# Patient Record
Sex: Female | Born: 1935
Health system: Southern US, Community
[De-identification: ages and names within clinical notes are randomized; demographics above are authoritative.]

## PROBLEM LIST (undated history)

## (undated) DIAGNOSIS — C189 Malignant neoplasm of colon, unspecified: Secondary | ICD-10-CM

## (undated) DIAGNOSIS — I499 Cardiac arrhythmia, unspecified: Secondary | ICD-10-CM

## (undated) DIAGNOSIS — I10 Essential (primary) hypertension: Secondary | ICD-10-CM

## (undated) DIAGNOSIS — R51 Headache: Secondary | ICD-10-CM

## (undated) DIAGNOSIS — E119 Type 2 diabetes mellitus without complications: Secondary | ICD-10-CM

## (undated) DIAGNOSIS — J309 Allergic rhinitis, unspecified: Secondary | ICD-10-CM

## (undated) DIAGNOSIS — G8929 Other chronic pain: Secondary | ICD-10-CM

## (undated) HISTORY — DX: Headache: R51

## (undated) HISTORY — DX: Malignant neoplasm of colon, unspecified: C18.9

## (undated) HISTORY — DX: Cardiac arrhythmia, unspecified: I49.9

## (undated) HISTORY — PX: VESICOVAGINAL FISTULA CLOSURE W/ TAH: SUR271

## (undated) HISTORY — PX: BACK SURGERY: SHX140

## (undated) HISTORY — PX: BREAST SURGERY: SHX581

## (undated) HISTORY — PX: CATARACT EXTRACTION: SUR2

## (undated) HISTORY — PX: CARDIAC VALVE SURGERY: SHX40

## (undated) HISTORY — DX: Allergic rhinitis, unspecified: J30.9

## (undated) HISTORY — DX: Essential (primary) hypertension: I10

## (undated) HISTORY — DX: Type 2 diabetes mellitus without complications: E11.9

## (undated) HISTORY — PX: APPENDECTOMY: SHX54

## (undated) HISTORY — PX: NECK SURGERY: SHX720

## (undated) HISTORY — PX: EYE SURGERY: SHX253

## (undated) HISTORY — DX: Other chronic pain: G89.29

---

## 2010-05-31 ENCOUNTER — Ambulatory Visit (INDEPENDENT_AMBULATORY_CARE_PROVIDER_SITE_OTHER): Payer: Medicare Other | Admitting: Internal Medicine

## 2010-05-31 ENCOUNTER — Encounter: Payer: Self-pay | Admitting: Internal Medicine

## 2010-05-31 ENCOUNTER — Ambulatory Visit (INDEPENDENT_AMBULATORY_CARE_PROVIDER_SITE_OTHER)
Admission: RE | Admit: 2010-05-31 | Discharge: 2010-05-31 | Disposition: A | Discharge status: Home / Self Care | Payer: Medicare Other | Source: Ambulatory Visit | Attending: Internal Medicine | Admitting: Internal Medicine

## 2010-05-31 VITALS — BP 118/76 | HR 54 | Ht 59.0 in | Wt 126.6 lb

## 2010-05-31 DIAGNOSIS — J449 Chronic obstructive pulmonary disease, unspecified: Secondary | ICD-10-CM

## 2010-05-31 DIAGNOSIS — J3089 Other allergic rhinitis: Secondary | ICD-10-CM

## 2010-05-31 DIAGNOSIS — J4489 Other specified chronic obstructive pulmonary disease: Secondary | ICD-10-CM

## 2010-05-31 DIAGNOSIS — J4540 Moderate persistent asthma, uncomplicated: Secondary | ICD-10-CM | POA: Insufficient documentation

## 2010-05-31 MED ORDER — TIOTROPIUM BROMIDE MONOHYDRATE 18 MCG IN CAPS: 18.0000 ug | ORAL_CAPSULE | Freq: Every day | RESPIRATORY_TRACT | Status: DC

## 2010-05-31 NOTE — Patient Instructions (Addendum)
Orders- CXR- dx COPD                Schedule PFT  Sample Spiriva  1 daily- watch to see if this helps shortness of breath.   Test for alpha 1 antitrypsin deficiency  Samples 1) Nasonex   1-2 puffs each nostril, once daily                 2) Pataday     1 drop in eye once daily as needed for allergy

## 2010-05-31 NOTE — Assessment & Plan Note (Signed)
We will get cxr and PFT  I will try her on Spiriva, which she doesn't remember using before.

## 2010-05-31 NOTE — Progress Notes (Signed)
  Subjective:    Patient ID: Donna Greene, female    DOB: 11-09-1925, 77 y.o.   MRN: [id number]  HPI 05/31/10- 43 yoF never smoker referred by Dr Milus Glazier for evaluation of COPD. Widowed- was married to a smoker. Long hx of adult asthma, worse living in this area. Several inhalers have had little effect or make her shaky.Morning cough- coffee helps clear her chest. Says allergies were a problem in [place]. Was on allergy vaccine there- helped nasal congestion and headache. Breathing is worse in cold air which increases nasal congestion and headache. Dyspnea with sustained walking Comorbidities significant for colon cancer/ hemicolectomy, DM, HBP, arrhythmia, C5-6 fusion after a fall, bilateral mastectomy.   Review of Systems Constitutional:   No weight loss, night sweats,  Fevers, chills, fatigue, lassitude. HEENT:   No headaches,  Difficulty swallowing,  Tooth/dental problems,  Sore throat,               Has sneezing, itching, ear ache, nasal congestion, post nasal drip,   CV:  No chest pain,  Orthopnea, PND, swelling in lower extremities, anasarca, dizziness, palpitations  GI  No heartburn, indigestion, abdominal pain, nausea, vomiting, diarrhea, change in bowel habits, loss of appetite  Resp: Shortness of breath with exertion and at rest.  No excess mucus, Has-productive cough,  No non-productive cough,  No coughing up of blood.  No change in color of mucus.  No wheezing.  Skin: no rash or lesions.  GU: no dysuria, change in color of urine, no urgency or frequency.  No flank pain.  MS:  No joint pain or swelling.  No decreased range of motion.  No back pain.  Psych:  No change in mood or affect. No depression or anxiety.  No memory loss.      Objective:   Physical Exam General- Alert, Oriented, Affect-appropriate, Distress- none acute   medium build  Skin- rash-none, lesions- none, excoriation- none  Lymphadenopathy- none  Head- atraumatic  Eyes- Gross vision intact, PERRLA,  conjunctivae clear secretions  Ears- Hearing, canals, Tm - normal for age,  Nose- Clear, Septal dev, mucus, polyps, erosion, perforation   Throat- Mallampati II , mucosa clear , drainage- none, tonsils- atrophic  Neck- flexible , trachea midline, no stridor , thyroid nl, carotid no bruit  Chest - symmetrical excursion , unlabored     Heart/CV- RRR , no murmur , no gallop  , no rub, nl s1 s2                     - JVD- none , edema- none, stasis changes- none, varices- none     Lung- clear to P&A, diminished      wheeze- none, cough- none , dullness-none, rub- none     Chest wall-   Abd- tender-no, distended-no, bowel sounds-present, HSM- no  Br/ Gen/ Rectal- Not done, not indicated  Extrem- cyanosis- none, clubbing, none, atrophy- none, strength- nl  Neuro- grossly intact to observation         Assessment & Plan:

## 2010-06-05 ENCOUNTER — Encounter: Payer: Self-pay | Admitting: Internal Medicine

## 2010-06-05 NOTE — Assessment & Plan Note (Signed)
Allergic rhinitis tends to fade with age. There may be a vasomotor rhinitis component.

## 2010-06-09 ENCOUNTER — Telehealth: Payer: Self-pay | Admitting: Internal Medicine

## 2010-06-09 NOTE — Telephone Encounter (Signed)
Spoke to pt and she states that the spiriva felt as though it made her symptoms worse she used for 10 days and could not tolerate any longer pt states she has pft appt at 11am on Monday and would like to see dr young at that time.scheduled pt to see dr young at 11:30 on monday

## 2010-06-09 NOTE — Telephone Encounter (Signed)
Called and spoke with pt and she stated that the spiriva causes her to cough very bad.  She tried this for 10 days and stopped.  Pt is willing to try another med if [facility] wants her to.  Please advise. thanks

## 2010-06-12 ENCOUNTER — Encounter: Payer: Self-pay | Admitting: Internal Medicine

## 2010-06-12 ENCOUNTER — Ambulatory Visit (INDEPENDENT_AMBULATORY_CARE_PROVIDER_SITE_OTHER): Payer: Medicare Other | Admitting: Internal Medicine

## 2010-06-12 VITALS — BP 112/70 | HR 60 | Ht 59.0 in | Wt 127.0 lb

## 2010-06-12 DIAGNOSIS — J449 Chronic obstructive pulmonary disease, unspecified: Secondary | ICD-10-CM

## 2010-06-12 DIAGNOSIS — J3089 Other allergic rhinitis: Secondary | ICD-10-CM

## 2010-06-12 LAB — PULMONARY FUNCTION TEST

## 2010-06-12 MED ORDER — TIOTROPIUM BROMIDE MONOHYDRATE 18 MCG IN CAPS: 18.0000 ug | ORAL_CAPSULE | Freq: Every day | RESPIRATORY_TRACT | Status: DC

## 2010-06-12 MED ORDER — ALBUTEROL SULFATE HFA 108 (90 BASE) MCG/ACT IN AERS: 2.0000 | INHALATION_SPRAY | Freq: Four times a day (QID) | RESPIRATORY_TRACT | Status: DC | PRN

## 2010-06-12 MED ORDER — ZAFIRLUKAST 10 MG PO TABS: 10.0000 mg | ORAL_TABLET | Freq: Two times a day (BID) | ORAL | Status: DC

## 2010-06-12 NOTE — Assessment & Plan Note (Signed)
Minor small airway response to dilator. Hard to say she has much reactive airways disease. She doesn't need meds daily and we discussed letting her use meds intermitently . That will avoid the overdrying she complains of.

## 2010-06-12 NOTE — Progress Notes (Signed)
Subjective:    Patient ID: Donna Greene, female    DOB: 11/26/1925, 77 y.o.   MRN: [id number]  HPI    Review of Systems     Objective:   Physical Exam        Assessment & Plan:   Subjective:    Patient ID: Donna Greene, female    DOB: September 27, 1925, 77 y.o.   MRN: [id number]  HPI 05/31/10- 36 yoF never smoker referred by Dr Milus Glazier for evaluation of COPD. Widowed- was married to a smoker. Long hx of adult asthma, worse living in this area. Several inhalers have had little effect or make her shaky.Morning cough- coffee helps clear her chest. Says allergies were a problem in [place]. Was on allergy vaccine there- helped nasal congestion and headache. Breathing is worse in cold air which increases nasal congestion and headache. Dyspnea with sustained walking Comorbidities significant for colon cancer/ hemicolectomy, DM, HBP, arrhythmia, C5-6 fusion after a fall, bilateral mastectomy.  06/12/10 COPD Says she is better today than last week. We had given sample of Spiriva which overdried making mucus clearance difficult. She stopped Spiriva and replaced it with albuterol and mucinex- doing better. She tends to feel similar with other inhalers.  PFT- Normal- high flow rates likely normal and reflecting lack of well-defined normals for this age range.  Spiriva did help for a few days. Accolate used to help, used once daily- discussed alternatives.  Review of Systems Constitutional:   No weight loss, night sweats,  Fevers, chills, fatigue, lassitude. HEENT:   No headaches,  Difficulty swallowing,  Tooth/dental problems,  Sore throat,               Has sneezing, itching, ear ache, nasal congestion, post nasal drip,   CV:  No chest pain,  Orthopnea, PND, swelling in lower extremities, anasarca, dizziness, palpitations  GI  No heartburn, indigestion, abdominal pain, nausea, vomiting, diarrhea, change in bowel habits, loss of appetite  Resp: Shortness of breath with exertion and at rest.   No excess mucus, Has-productive cough,  No non-productive cough,  No coughing up of blood.  No change in color of mucus.  No wheezing.  Skin: no rash or lesions.  GU: no dysuria, change in color of urine, no urgency or frequency.  No flank pain.  MS:  No joint pain or swelling.  No decreased range of motion.  No back pain.  Psych:  No change in mood or affect. No depression or anxiety.  No memory loss.      Objective:   Physical Exam General- Alert, Oriented, Affect-appropriate, Distress- none acute   medium build  Skin- rash-none, lesions- none, excoriation- none  Lymphadenopathy- none  Head- atraumatic  Eyes- Gross vision intact, PERRLA, conjunctivae clear secretions  Ears- Hearing, canals, Tm - normal for age,  Nose- Clear, No-Septal dev, mucus, polyps, erosion, perforation   Throat- Mallampati II , mucosa clear , drainage- none, tonsils- atrophic  Neck- flexible , trachea midline, no stridor , thyroid nl, carotid no bruit  Chest - symmetrical excursion , unlabored     Heart/CV- RRR , no murmur , no gallop  , no rub, nl s1 s2                     - JVD- none , edema- none, stasis changes- none, varices- none     Lung- very clear to P&A,      wheeze- none, cough- none , dullness-none, rub- none     Chest  wall-   Abd- tender-no, distended-no, bowel sounds-present, HSM- no  Br/ Gen/ Rectal- Not done, not indicated  Extrem- cyanosis- none, clubbing, none, atrophy- none, strength- nl  Neuro- grossly intact to observation         Assessment & Plan:

## 2010-06-12 NOTE — Patient Instructions (Addendum)
Accolate script- can use up to twice daily before meals if needed  Spiriva script- can use one daily as needed  Proair rescue inhaler script- can use 2 puffs, up to 4 times daily as needed.

## 2010-06-12 NOTE — Progress Notes (Signed)
PFT done today. 

## 2010-06-14 NOTE — Assessment & Plan Note (Signed)
Has had nasal congestion and drainage at times, but not an active issue at this time of year.

## 2010-06-26 ENCOUNTER — Encounter: Payer: Self-pay | Admitting: Internal Medicine

## 2010-07-13 ENCOUNTER — Ambulatory Visit: Payer: Medicare Other | Admitting: Internal Medicine

## 2010-10-09 ENCOUNTER — Ambulatory Visit (INDEPENDENT_AMBULATORY_CARE_PROVIDER_SITE_OTHER): Payer: Medicare Other | Admitting: Internal Medicine

## 2010-10-09 ENCOUNTER — Encounter: Payer: Self-pay | Admitting: Internal Medicine

## 2010-10-09 VITALS — BP 142/82 | HR 63 | Ht 59.0 in | Wt 130.2 lb

## 2010-10-09 DIAGNOSIS — J449 Chronic obstructive pulmonary disease, unspecified: Secondary | ICD-10-CM

## 2010-10-09 DIAGNOSIS — J3089 Other allergic rhinitis: Secondary | ICD-10-CM

## 2010-10-09 NOTE — Progress Notes (Signed)
Patient ID: Donna Greene, female    DOB: October 15, 1925, 77 y.o.   MRN: [id number]  HPI 05/31/10- 50 yoF never smoker referred by Dr Milus Glazier for evaluation of COPD. Widowed- was married to a smoker. Long hx of adult asthma, worse living in this area. Several inhalers have had little effect or make her shaky.Morning cough- coffee helps clear her chest. Says allergies were a problem in [place]. Was on allergy vaccine there- helped nasal congestion and headache. Breathing is worse in cold air which increases nasal congestion and headache. Dyspnea with sustained walking Comorbidities significant for colon cancer/ hemicolectomy, DM, HBP, arrhythmia, C5-6 fusion after a fall, bilateral mastectomy.  06/12/10 COPD, Comorbidities significant for colon cancer/ hemicolectomy, DM, HBP, arrhythmia, C5-6 fusion after a fall, bilateral mastectomy. Says she is better today than last week. We had given sample of Spiriva which overdried making mucus clearance difficult. She stopped Spiriva and replaced it with albuterol and mucinex- doing better. She tends to feel similar with other inhalers.  PFT- Normal- high flow rates likely normal and reflecting lack of well-defined normals for this age range.  Spiriva did help for a few days. Accolate used to help, used once daily- discussed alternatives.  10/09/10- COPD-from passive smoking, Comorbidities significant for colon cancer/ hemicolectomy, DM, HBP, arrhythmia, C5-6 fusion after a fall, bilateral mastectomy. Meds dry her chest too much- she says all MDIs and even her nebulizer meds do that. Then, while in [place], they tried allergy shots- that helped her nose but not her chest. Complains that nasal sprays burn nose. She tried spring Nasonex on a Q-tip and wiping her nose was packed, but it did not help. Uses rescue inhaler only very occasionally. Accolate used to help but no longer helps now for more than 2 or 3 days at a time as measured by chest tightness and trace wheeze.  She denies attacks of shortness of breath. She is aware that she lost height and she fell and had compression fractures of vertebrae. We discussed how thoracic height loss can aggravate a sense of dyspnea by causing restriction. Chest x-ray 05/31/2010 had shown COPD with no acute process. Will get her flu vax at Dr Lauentsein's/ PCP.  ROS- Constitutional:   No-   weight loss, night sweats, fevers, chills, fatigue, lassitude. HEENT:   No-  headaches, difficulty swallowing, tooth/dental problems, sore throat,       No-  sneezing, itching, ear ache,   +nasal congestion, post nasal drip,  CV:  No-   chest pain, orthopnea, PND, swelling in lower extremities, anasarca, dizziness, palpitations Resp: + shortness of breath with exertion or at rest.              No-   productive cough,  No non-productive cough,  No-  coughing up of blood.              No-   change in color of mucus.  Little wheezing.   Skin: No-   rash or lesions. GI:  No-   heartburn, indigestion, abdominal pain, nausea, vomiting, diarrhea,                 change in bowel habits, loss of appetite GU: No-   dysuria, change in color of urine, no urgency or frequency.  No- flank pain. MS:  No-   joint pain or swelling.  No- decreased range of motion.  No- back pain. Neuro- grossly normal to observation, Or:  Psych:  No- change in mood or affect. No  depression or anxiety.  No memory loss.      Objective:   Physical Exam General- Alert, Oriented, Affect-appropriate, Distress- none acute, small, elderly woman. Skin- rash-none, lesions- none, excoriation- none Lymphadenopathy- none Head- atraumatic            Eyes- Gross vision intact, PERRLA, conjunctivae clear secretions            Ears- Hearing, canals            Nose- Clear, Septal dev, mucus, polyps, erosion, perforation             Throat- Mallampati II , mucosa clear , drainage- none, tonsils- atrophic Neck- flexible , trachea midline, no stridor , thyroid nl, carotid no  bruit Chest - symmetrical excursion , unlabored           Heart/CV- RRR , no murmur , no gallop  , no rub, nl s1 s2                           - JVD- none , edema- none, stasis changes- none, varices- none           Lung- clear to P&A, diminished and quiet, wheeze- none, cough- none , dullness-none, rub- none           Chest wall-  Abd- tender-no, distended-no, bowel sounds-present, HSM- no Br/ Gen/ Rectal- Not done, not indicated Extrem- cyanosis- none, clubbing, none, atrophy- none, strength- nl Neuro- grossly intact to observation

## 2010-10-09 NOTE — Patient Instructions (Addendum)
Neb treatment here just with saline for trial if available>> do not have normal saline available for nebulization of the esophagus now.  Try nasal saline gel- otc- ask at drug store

## 2010-10-12 NOTE — Assessment & Plan Note (Signed)
Per nasal complaints are more characteristic of overly dry mucosa rather than allergy. There may be a vasomotor component.

## 2010-10-12 NOTE — Assessment & Plan Note (Signed)
For her age, exam is unremarkable and it is difficult to attach objective findings to her complaints.

## 2010-10-20 ENCOUNTER — Other Ambulatory Visit: Payer: Self-pay | Admitting: Family Medicine

## 2010-10-20 DIAGNOSIS — R0781 Pleurodynia: Secondary | ICD-10-CM

## 2010-10-24 ENCOUNTER — Ambulatory Visit
Admission: RE | Admit: 2010-10-24 | Discharge: 2010-10-24 | Disposition: A | Discharge status: Home / Self Care | Payer: Medicare Other | Source: Ambulatory Visit | Attending: Family Medicine | Admitting: Family Medicine

## 2010-10-24 DIAGNOSIS — R0781 Pleurodynia: Secondary | ICD-10-CM

## 2011-01-16 DIAGNOSIS — M5126 Other intervertebral disc displacement, lumbar region: Secondary | ICD-10-CM | POA: Diagnosis not present

## 2011-01-16 DIAGNOSIS — M545 Low back pain: Secondary | ICD-10-CM | POA: Diagnosis not present

## 2011-01-31 DIAGNOSIS — IMO0002 Reserved for concepts with insufficient information to code with codable children: Secondary | ICD-10-CM | POA: Diagnosis not present

## 2011-01-31 DIAGNOSIS — M5137 Other intervertebral disc degeneration, lumbosacral region: Secondary | ICD-10-CM | POA: Diagnosis not present

## 2011-01-31 DIAGNOSIS — M545 Low back pain: Secondary | ICD-10-CM | POA: Diagnosis not present

## 2011-02-13 DIAGNOSIS — M5137 Other intervertebral disc degeneration, lumbosacral region: Secondary | ICD-10-CM | POA: Diagnosis not present

## 2011-03-12 DIAGNOSIS — M545 Low back pain: Secondary | ICD-10-CM | POA: Diagnosis not present

## 2011-03-12 DIAGNOSIS — M47817 Spondylosis without myelopathy or radiculopathy, lumbosacral region: Secondary | ICD-10-CM | POA: Diagnosis not present

## 2011-03-12 DIAGNOSIS — M5137 Other intervertebral disc degeneration, lumbosacral region: Secondary | ICD-10-CM | POA: Diagnosis not present

## 2011-03-20 DIAGNOSIS — M545 Low back pain: Secondary | ICD-10-CM | POA: Diagnosis not present

## 2011-03-20 DIAGNOSIS — M47817 Spondylosis without myelopathy or radiculopathy, lumbosacral region: Secondary | ICD-10-CM | POA: Diagnosis not present

## 2011-04-18 DIAGNOSIS — IMO0002 Reserved for concepts with insufficient information to code with codable children: Secondary | ICD-10-CM | POA: Diagnosis not present

## 2011-04-18 DIAGNOSIS — M545 Low back pain: Secondary | ICD-10-CM | POA: Diagnosis not present

## 2011-04-18 DIAGNOSIS — M47817 Spondylosis without myelopathy or radiculopathy, lumbosacral region: Secondary | ICD-10-CM | POA: Diagnosis not present

## 2011-04-24 DIAGNOSIS — M545 Low back pain: Secondary | ICD-10-CM | POA: Diagnosis not present

## 2011-04-24 DIAGNOSIS — IMO0002 Reserved for concepts with insufficient information to code with codable children: Secondary | ICD-10-CM | POA: Diagnosis not present

## 2011-05-14 DIAGNOSIS — IMO0002 Reserved for concepts with insufficient information to code with codable children: Secondary | ICD-10-CM | POA: Diagnosis not present

## 2011-05-14 DIAGNOSIS — M546 Pain in thoracic spine: Secondary | ICD-10-CM | POA: Diagnosis not present

## 2011-05-15 ENCOUNTER — Ambulatory Visit (INDEPENDENT_AMBULATORY_CARE_PROVIDER_SITE_OTHER): Payer: Medicare Other | Admitting: Family Medicine

## 2011-05-15 VITALS — BP 196/65 | HR 66 | Temp 97.8°F | Resp 16 | Ht 60.0 in | Wt 132.0 lb

## 2011-05-15 DIAGNOSIS — R1110 Vomiting, unspecified: Secondary | ICD-10-CM

## 2011-05-15 DIAGNOSIS — M549 Dorsalgia, unspecified: Secondary | ICD-10-CM

## 2011-05-15 DIAGNOSIS — K591 Functional diarrhea: Secondary | ICD-10-CM | POA: Diagnosis not present

## 2011-05-15 DIAGNOSIS — R55 Syncope and collapse: Secondary | ICD-10-CM

## 2011-05-15 DIAGNOSIS — R197 Diarrhea, unspecified: Secondary | ICD-10-CM

## 2011-05-15 LAB — COMPREHENSIVE METABOLIC PANEL
ALT: 19 U/L (ref 0–35)
AST: 29 U/L (ref 0–37)
Albumin: 4.4 g/dL (ref 3.5–5.2)
Alkaline Phosphatase: 72 U/L (ref 39–117)
BUN: 17 mg/dL (ref 6–23)
CO2: 25 mEq/L (ref 19–32)
Calcium: 9.5 mg/dL (ref 8.4–10.5)
Chloride: 102 mEq/L (ref 96–112)
Creat: 0.69 mg/dL (ref 0.50–1.10)
Glucose, Bld: 114 mg/dL — ABNORMAL HIGH (ref 70–99)
Potassium: 4 mEq/L (ref 3.5–5.3)
Sodium: 136 mEq/L (ref 135–145)
Total Bilirubin: 0.5 mg/dL (ref 0.3–1.2)
Total Protein: 7.3 g/dL (ref 6.0–8.3)

## 2011-05-15 LAB — POCT CBC
Granulocyte percent: 81.8 %G — AB (ref 37–80)
HCT, POC: 41.6 % (ref 37.7–47.9)
Hemoglobin: 13.8 g/dL (ref 12.2–16.2)
Lymph, poc: 2.2 (ref 0.6–3.4)
MCH, POC: 30.1 pg (ref 27–31.2)
MCHC: 33.2 g/dL (ref 31.8–35.4)
MCV: 90.9 fL (ref 80–97)
MID (cbc): 0.9 (ref 0–0.9)
MPV: 10.1 fL (ref 0–99.8)
POC Granulocyte: 14.1 — AB (ref 2–6.9)
POC LYMPH PERCENT: 13 %L (ref 10–50)
POC MID %: 5.2 %M (ref 0–12)
Platelet Count, POC: 307 10*3/uL (ref 142–424)
RBC: 4.58 M/uL (ref 4.04–5.48)
RDW, POC: 13.9 %
WBC: 17.2 10*3/uL — AB (ref 4.6–10.2)

## 2011-05-15 LAB — POCT SEDIMENTATION RATE: POCT SED RATE: 24 mm/hr — AB (ref 0–22)

## 2011-05-15 LAB — TSH: TSH: 0.93 u[IU]/mL (ref 0.350–4.500)

## 2011-05-15 NOTE — Patient Instructions (Signed)
Syncope You have had a fainting (syncopal) spell. A fainting episode is a sudden, short-lived loss of consciousness. It results in complete recovery. It occurs because there has been a temporary shortage of oxygen and/or sugar (glucose) to the brain. CAUSES   Blood pressure pills and other medications that may lower blood pressure below normal. Sudden changes in posture (sudden standing).   Over-medication. Take your medications as directed.   Standing too long. This can cause blood to pool in the legs.   Seizure disorders.   Low blood sugar (hypoglycemia) of diabetes. This more commonly causes coma.   Bearing down to go to the bathroom. This can cause your blood pressure to rise suddenly. Your body compensates by making the blood pressure too low when you stop bearing down.   Hardening of the arteries where the brain temporarily does not receive enough blood.   Irregular heart beat and circulatory problems.   Fear, emotional distress, injury, sight of blood, or illness.  Your caregiver will send you home if the syncope was from non-worrisome causes (benign). Depending on your age and health, you may stay to be monitored and observed. If you return home, have someone stay with you if your caregiver feels that is desirable. It is very important to keep all follow-up referrals and appointments in order to properly manage this condition. This is a serious problem which can lead to serious illness and death if not carefully managed.  WARNING: Do not drive or operate machinery until your caregiver feels that it is safe for you to do so. SEEK IMMEDIATE MEDICAL CARE IF:   You have another fainting episode or faint while lying or sitting down. DO NOT DRIVE YOURSELF. Call 911 if no other help is available.   You have chest pain, are feeling sick to your stomach (nausea), vomiting or abdominal pain.   You have an irregular heartbeat or one that is very fast (pulse over 120 beats per minute).    You have a loss of feeling in some part of your body or lose movement in your arms or legs.   You have difficulty with speech, confusion, severe weakness, or visual problems.   You become sweaty and/or feel light headed.  Make sure you are rechecked as instructed. Document Released: 12/25/2004 Document Revised: 12/14/2010 Document Reviewed: 08/15/2006 [employer] Patient Information 2012 [employer]. 

## 2011-05-15 NOTE — Progress Notes (Signed)
This 78 yo woman comes in worried about cancer.  She says that she has had diarrhea and vomiting, but is not losing weight.  She wants some tests to find out what is the problem.  Last colonoscopy 11/11 in [place] with Reece Agar, MD  1. Patient continues to have presyncopal episodes.  Seeing Dr. Jacinto Halim. 2. Patient has allergies 3. She is being treated for chronic back pain with steroid shots.  (Dr. Andria Rhein)  Patient lives 5 houses down from daughter Dorna Mai.  O:  NAD, pleasant Patient tends to ramble Neck:  No bruit Chest:  Clear Heart: regular, no murmur  A:  Chronic anxiety, past serious bowel problem with resection.  P:  May benefit from probiotics.

## 2011-05-17 DIAGNOSIS — R002 Palpitations: Secondary | ICD-10-CM | POA: Diagnosis not present

## 2011-05-17 DIAGNOSIS — I10 Essential (primary) hypertension: Secondary | ICD-10-CM | POA: Diagnosis not present

## 2011-05-17 DIAGNOSIS — R0602 Shortness of breath: Secondary | ICD-10-CM | POA: Diagnosis not present

## 2011-05-17 DIAGNOSIS — I498 Other specified cardiac arrhythmias: Secondary | ICD-10-CM | POA: Diagnosis not present

## 2011-06-08 DIAGNOSIS — S32009A Unspecified fracture of unspecified lumbar vertebra, initial encounter for closed fracture: Secondary | ICD-10-CM | POA: Diagnosis not present

## 2011-06-08 DIAGNOSIS — IMO0002 Reserved for concepts with insufficient information to code with codable children: Secondary | ICD-10-CM | POA: Diagnosis not present

## 2011-06-15 ENCOUNTER — Telehealth: Payer: Self-pay

## 2011-06-15 NOTE — Telephone Encounter (Signed)
.  [facility] The patient requested return call from Dr. Milus Glazier.  Please call patient at [phone].

## 2011-06-15 NOTE — Telephone Encounter (Signed)
Spoke w/pt and she stated that her back doctors, Dr Noel Gerold and Dr Rubin Payor, want Dr Milus Glazier to put pt back on Forteo now in hopes of strengthening her bones enough to do the surgery that is needed for her back. Pt wondered if Dr L can [facility] [place] [place], or if she needs OV first?

## 2011-06-15 NOTE — Telephone Encounter (Signed)
Please call in Forteo prescription for patient or have her specialists order what they said she needs.

## 2011-06-16 ENCOUNTER — Other Ambulatory Visit: Payer: Self-pay | Admitting: Family Medicine

## 2011-06-16 DIAGNOSIS — M810 Age-related osteoporosis without current pathological fracture: Secondary | ICD-10-CM

## 2011-06-16 MED ORDER — TERIPARATIDE (RECOMBINANT) 600 MCG/2.4ML ~~LOC~~ SOLN: 20.0000 ug | Freq: Every day | SUBCUTANEOUS | Status: DC

## 2011-06-18 ENCOUNTER — Other Ambulatory Visit: Payer: Self-pay | Admitting: Family Medicine

## 2011-06-18 NOTE — Telephone Encounter (Signed)
Not for DM, for Forteo injections.  Ok per [place] to rx.

## 2011-06-18 NOTE — Telephone Encounter (Signed)
I do not see why this patient needs these?  Does she have DM?  Who manages it?

## 2011-07-11 DIAGNOSIS — M546 Pain in thoracic spine: Secondary | ICD-10-CM | POA: Diagnosis not present

## 2011-07-11 DIAGNOSIS — M545 Low back pain: Secondary | ICD-10-CM | POA: Diagnosis not present

## 2011-07-11 DIAGNOSIS — IMO0002 Reserved for concepts with insufficient information to code with codable children: Secondary | ICD-10-CM | POA: Diagnosis not present

## 2011-07-11 DIAGNOSIS — S32009A Unspecified fracture of unspecified lumbar vertebra, initial encounter for closed fracture: Secondary | ICD-10-CM | POA: Diagnosis not present

## 2011-07-30 ENCOUNTER — Telehealth: Payer: Self-pay

## 2011-07-30 NOTE — Telephone Encounter (Signed)
Pt brought in paperwork showing the medications in injections she has been getting at Dr Penny Pia office. I have made a copy and put in Dr Cain Saupe box. Pt states that the injections are helping the pain in her back, so she would like to continue them, but she has been taking Forteo in order to strengthen her bones and she is afraid that the medication in the injections might weaken her bones. She doesn't want to do anything to counter the benefits from the [place]. She trusts Dr L and would like his opinion as to if he thinks it is OK for her to continue the injections. Dr Noel Gerold has said that her bones are not strong enough at this point for surgery. Also pt wondered if Dr Retia Passe had contacted Dr Elbert Ewings - he had told her that he and Dr Noel Gerold and Dr L would consult to determine plan for pt.

## 2011-07-30 NOTE — Telephone Encounter (Signed)
Although the injections have a small effect on bone strength, the Forteo will protect her, and, in fact, lead to much stronger bones in spite of the slight negative effects of the injections

## 2011-07-30 NOTE — Telephone Encounter (Signed)
[phone] Patient would like to know if the medication that she is being injected with is going to make her bones weaker since her specialist told her that her bones are too fragile to undergo surgery.

## 2011-07-31 NOTE — Telephone Encounter (Signed)
Gave pt message from Dr L and also encouraged her to RTC to see him about her knee pain which she reported when I talked w/her yesterday. She stated that she has fallen a couple of times due to it and we discussed safety and that she should discuss w/him and possible use of walker. Pt agreed and gave her his sch this week

## 2011-08-03 ENCOUNTER — Ambulatory Visit (INDEPENDENT_AMBULATORY_CARE_PROVIDER_SITE_OTHER): Payer: Medicare Other | Admitting: Family Medicine

## 2011-08-03 ENCOUNTER — Ambulatory Visit: Payer: Medicare Other

## 2011-08-03 VITALS — BP 170/68 | HR 62 | Temp 98.3°F | Resp 16 | Ht 60.0 in | Wt 133.0 lb

## 2011-08-03 DIAGNOSIS — M4800 Spinal stenosis, site unspecified: Secondary | ICD-10-CM

## 2011-08-03 DIAGNOSIS — R350 Frequency of micturition: Secondary | ICD-10-CM | POA: Diagnosis not present

## 2011-08-03 DIAGNOSIS — M25569 Pain in unspecified knee: Secondary | ICD-10-CM | POA: Diagnosis not present

## 2011-08-03 DIAGNOSIS — N390 Urinary tract infection, site not specified: Secondary | ICD-10-CM

## 2011-08-03 DIAGNOSIS — M25561 Pain in right knee: Secondary | ICD-10-CM

## 2011-08-03 LAB — POCT URINALYSIS DIPSTICK
Bilirubin, UA: NEGATIVE
Glucose, UA: NEGATIVE
Ketones, UA: NEGATIVE
Leukocytes, UA: NEGATIVE
Nitrite, UA: NEGATIVE
Protein, UA: NEGATIVE
Spec Grav, UA: <=1.005
Urobilinogen, UA: 0.2
pH, UA: 5.5

## 2011-08-03 LAB — POCT UA - MICROSCOPIC ONLY
Casts, Ur, LPF, POC: NEGATIVE
Crystals, Ur, HPF, POC: NEGATIVE
Epithelial cells, urine per micros: NEGATIVE
Mucus, UA: NEGATIVE
WBC, Ur, HPF, POC: NEGATIVE
Yeast, UA: NEGATIVE

## 2011-08-03 MED ORDER — CIPROFLOXACIN HCL 250 MG PO TABS: 250.0000 mg | ORAL_TABLET | Freq: Two times a day (BID) | ORAL | Status: AC

## 2011-08-03 MED ORDER — METHYLPREDNISOLONE ACETATE 80 MG/ML IJ SUSP
80.0000 mg | Freq: Once | INTRAMUSCULAR | Status: AC
  Administered 2011-08-03: 80 mg via INTRA_ARTICULAR

## 2011-08-03 NOTE — Progress Notes (Signed)
78 yo with osteoporosis who has had two significant falls in last two months.  She has struck her right knee and now has persistent pain with weight bearing.  No LOC, tends to trip on things because she doesn't lift leg  She has chronic back pain and surgeons refuse to do surgery because of the osteoporosis.  Interested in 2nd opinion because back is getting so bad.  Tramadol affects the bladder, does relieve back pain. Right side no longer hurts.  Objective:  Seen with daughter Right knee:  Mild medial joint line soft tissue swelling with no fluctuance.  From, NONTENDER, no ecchymosis Patient alert and articulate. [facility] reading (PRIMARY) by  Dr. Milus Glazier:  Joint space narrowing. Results for orders placed in visit on 08/03/11  POCT URINALYSIS DIPSTICK      Component Value Range   Color, UA yellow     Clarity, UA clear     Glucose, UA neg     Bilirubin, UA neg     Ketones, UA neg     Spec Grav, UA <=1.005     Blood, UA mod     pH, UA 5.5     Protein, UA neg     Urobilinogen, UA 0.2     Nitrite, UA neg     Leukocytes, UA Negative    POCT UA - MICROSCOPIC ONLY      Component Value Range   WBC, Ur, HPF, POC neg     RBC, urine, microscopic 0-2     Bacteria, U Microscopic trace     Mucus, UA neg     Epithelial cells, urine per micros neg     Crystals, Ur, HPF, POC neg     Casts, Ur, LPF, POC neg     Yeast, UA neg     A:   1. Knee pain, right  DG Knee Complete 4 Views Right, methylPREDNISolone acetate (DEPO-MEDROL) injection 80 mg  2. Urinary frequency  Urine culture, POCT urinalysis dipstick, POCT UA - Microscopic Only, ciprofloxacin (CIPRO) 250 MG tablet  3. Spinal stenosis  Ambulatory referral to Neurosurgery

## 2011-08-05 LAB — URINE CULTURE: Colony Count: 45000

## 2011-08-28 ENCOUNTER — Telehealth: Payer: Self-pay

## 2011-08-28 NOTE — Telephone Encounter (Signed)
xcvsd

## 2011-09-25 ENCOUNTER — Ambulatory Visit (INDEPENDENT_AMBULATORY_CARE_PROVIDER_SITE_OTHER): Payer: Medicare Other | Admitting: Family Medicine

## 2011-09-25 VITALS — BP 140/68 | HR 75 | Temp 98.1°F | Resp 18 | Ht 59.5 in | Wt 134.8 lb

## 2011-09-25 DIAGNOSIS — L02239 Carbuncle of trunk, unspecified: Secondary | ICD-10-CM | POA: Diagnosis not present

## 2011-09-25 DIAGNOSIS — L02229 Furuncle of trunk, unspecified: Secondary | ICD-10-CM

## 2011-09-25 DIAGNOSIS — Z0000 Encounter for general adult medical examination without abnormal findings: Secondary | ICD-10-CM

## 2011-09-25 DIAGNOSIS — Z23 Encounter for immunization: Secondary | ICD-10-CM

## 2011-09-25 DIAGNOSIS — S32009A Unspecified fracture of unspecified lumbar vertebra, initial encounter for closed fracture: Secondary | ICD-10-CM

## 2011-09-25 DIAGNOSIS — S32040A Wedge compression fracture of fourth lumbar vertebra, initial encounter for closed fracture: Secondary | ICD-10-CM

## 2011-09-25 DIAGNOSIS — L02221 Furuncle of abdominal wall: Secondary | ICD-10-CM

## 2011-09-25 MED ORDER — HYDROCODONE-ACETAMINOPHEN 5-500 MG PO TABS: 1.0000 | ORAL_TABLET | Freq: Three times a day (TID) | ORAL | Status: DC | PRN

## 2011-09-25 MED ORDER — DOXYCYCLINE HYCLATE 100 MG PO TABS: 100.0000 mg | ORAL_TABLET | Freq: Two times a day (BID) | ORAL | Status: DC

## 2011-09-25 MED ORDER — INFLUENZA VIRUS VACC SPLIT PF IM SUSP: 0.5000 mL | INTRAMUSCULAR | Status: DC

## 2011-09-25 NOTE — Progress Notes (Signed)
78 yo with two months of skin lesion on abdomen which has pus and bleeds. Has been using cortaid and the lesion has spread and grown.  Exposed to MRSA through retarded granddaughter.  Also has violaceous lesion on right cheek for last week.  Nontender  No further syncopal spells.  Occasionally light headed.  The back pain issue continues.  Dr. Newell Coral has her records. (She wants a second opinion after Dr. Noel Gerold decided she was not a good candidate for surgery)  Patient has been taking Forteo.  Right knee is improving  Objective:  NAD Two 5 mm lesions on right epigastric area with pustular open centers  1 cm indurated violaceous area right cheek.  Right knee synovial thickening, full ROM, nontender, no crepitus  45 min face to face with patient 1. Furunculosis of abdominal wall  doxycycline (VIBRA-TABS) 100 MG tablet  2. Compression fracture of L4 lumbar vertebra  Ambulatory referral to Neurosurgery, HYDROcodone-acetaminophen (VICODIN) 5-500 MG per tablet  3. Healthcare maintenance  influenza  inactive virus vaccine (FLUZONE/FLUARIX) injection 0.5 mL   Zoster vaccine Rx given  follow up 1 week. Stop the cortaid

## 2011-10-02 ENCOUNTER — Ambulatory Visit (INDEPENDENT_AMBULATORY_CARE_PROVIDER_SITE_OTHER): Payer: Medicare Other | Admitting: Family Medicine

## 2011-10-02 VITALS — BP 132/78 | HR 66 | Temp 98.6°F | Resp 18 | Ht 59.5 in | Wt 134.0 lb

## 2011-10-02 DIAGNOSIS — L0202 Furuncle of face: Secondary | ICD-10-CM

## 2011-10-02 DIAGNOSIS — L02219 Cutaneous abscess of trunk, unspecified: Secondary | ICD-10-CM | POA: Diagnosis not present

## 2011-10-02 DIAGNOSIS — L02221 Furuncle of abdominal wall: Secondary | ICD-10-CM

## 2011-10-02 DIAGNOSIS — L0203 Carbuncle of face: Secondary | ICD-10-CM | POA: Diagnosis not present

## 2011-10-02 DIAGNOSIS — L03311 Cellulitis of abdominal wall: Secondary | ICD-10-CM

## 2011-10-02 DIAGNOSIS — L03319 Cellulitis of trunk, unspecified: Secondary | ICD-10-CM | POA: Diagnosis not present

## 2011-10-02 MED ORDER — DOXYCYCLINE HYCLATE 100 MG PO TABS: 100.0000 mg | ORAL_TABLET | Freq: Two times a day (BID) | ORAL | Status: DC

## 2011-10-02 MED ORDER — MUPIROCIN 2 % EX OINT: TOPICAL_OINTMENT | Freq: Three times a day (TID) | CUTANEOUS | Status: DC

## 2011-10-02 NOTE — Patient Instructions (Addendum)
Continue doxycycline for an additional week Use bactroban ointment on the sores.  See Dr. Milus Glazier in 5-7 days.

## 2011-10-02 NOTE — Progress Notes (Signed)
Subjective: Patient saw Dr. Elbert Ewings. last week and was treated with doxycycline for some sores on her abdominal wall. They've improved a little bit. She had a cluster of 3 places. The largest one continues to persist but is smaller than it was one just above it is smaller the one below is about gone. She thinks it may have been a spider bite. No culture was taken. She did not have an I&D done last week. She came back in here today having been told return in a week and get a penicillin shot.  Objective 1 cm nodule area on her mid abdominal wall with a tie placed just above it. Both are wrong the source of surfaces of them and keep on using a little bit of blood. Arch or one has central pallor to it it looked like it had pus in it although it felt fairly firm. Simple I&D was performed using ethyl chloride and dental #11 blade. A tiny incision was made right in the main area of the nodule, but no pus was obtained. The drainage was mostly bloody and was cultured.  Assessment: Residual abdominal wall abscesses, slowly improving  Plan: I think these are probably mostly sterile. We'll continue the doxycycline for a week and wait for the cultures to come back. I do not feel like a metabolic injection is indicated at this time today. Give Bactroban ointment to use on the place. Continue doxycycline for an additional week Use bactroban ointment on the sores.

## 2011-10-04 ENCOUNTER — Other Ambulatory Visit: Payer: Self-pay | Admitting: Family Medicine

## 2011-10-05 LAB — WOUND CULTURE: Gram Stain: NONE SEEN

## 2011-10-23 DIAGNOSIS — L821 Other seborrheic keratosis: Secondary | ICD-10-CM | POA: Diagnosis not present

## 2011-10-23 DIAGNOSIS — L578 Other skin changes due to chronic exposure to nonionizing radiation: Secondary | ICD-10-CM | POA: Diagnosis not present

## 2011-10-23 DIAGNOSIS — R233 Spontaneous ecchymoses: Secondary | ICD-10-CM | POA: Diagnosis not present

## 2011-10-23 DIAGNOSIS — L570 Actinic keratosis: Secondary | ICD-10-CM | POA: Diagnosis not present

## 2011-10-26 DIAGNOSIS — IMO0002 Reserved for concepts with insufficient information to code with codable children: Secondary | ICD-10-CM | POA: Diagnosis not present

## 2011-10-26 DIAGNOSIS — IMO0001 Reserved for inherently not codable concepts without codable children: Secondary | ICD-10-CM | POA: Diagnosis not present

## 2011-10-26 DIAGNOSIS — M545 Low back pain: Secondary | ICD-10-CM | POA: Diagnosis not present

## 2011-10-26 DIAGNOSIS — S32009A Unspecified fracture of unspecified lumbar vertebra, initial encounter for closed fracture: Secondary | ICD-10-CM | POA: Diagnosis not present

## 2011-10-30 DIAGNOSIS — H353190 Nonexudative age-related macular degeneration, unspecified eye, stage unspecified: Secondary | ICD-10-CM | POA: Diagnosis not present

## 2011-10-30 DIAGNOSIS — H04129 Dry eye syndrome of unspecified lacrimal gland: Secondary | ICD-10-CM | POA: Diagnosis not present

## 2011-12-24 ENCOUNTER — Other Ambulatory Visit: Payer: Self-pay | Admitting: Physician Assistant

## 2012-01-23 DIAGNOSIS — L570 Actinic keratosis: Secondary | ICD-10-CM | POA: Diagnosis not present

## 2012-01-23 DIAGNOSIS — L821 Other seborrheic keratosis: Secondary | ICD-10-CM | POA: Diagnosis not present

## 2012-01-23 DIAGNOSIS — R233 Spontaneous ecchymoses: Secondary | ICD-10-CM | POA: Diagnosis not present

## 2012-04-07 ENCOUNTER — Other Ambulatory Visit: Payer: Self-pay | Admitting: Physician Assistant

## 2012-04-07 NOTE — Telephone Encounter (Signed)
PT STATES SHE RECEIVED HER MEDS EXCEPT HER NEEDLES AND SHE REALLY NEED THEM PLEASE CALL [phone]   [facility]

## 2012-05-15 ENCOUNTER — Encounter: Payer: Self-pay | Admitting: Family Medicine

## 2012-05-15 ENCOUNTER — Ambulatory Visit (INDEPENDENT_AMBULATORY_CARE_PROVIDER_SITE_OTHER): Payer: Medicare Other | Admitting: Family Medicine

## 2012-05-15 VITALS — BP 154/74 | HR 77 | Temp 97.8°F | Resp 18 | Ht 60.0 in | Wt 134.6 lb

## 2012-05-15 DIAGNOSIS — J309 Allergic rhinitis, unspecified: Secondary | ICD-10-CM

## 2012-05-15 DIAGNOSIS — M5430 Sciatica, unspecified side: Secondary | ICD-10-CM

## 2012-05-15 DIAGNOSIS — M545 Low back pain, unspecified: Secondary | ICD-10-CM | POA: Diagnosis not present

## 2012-05-15 DIAGNOSIS — M5431 Sciatica, right side: Secondary | ICD-10-CM

## 2012-05-15 MED ORDER — PREDNISONE 20 MG PO TABS: ORAL_TABLET | ORAL | Status: DC

## 2012-05-15 NOTE — Progress Notes (Signed)
Son has moved in with patient.  She's had to adjust to his being in same house, leaving dishes.  Son is now disabled and not working.  Patient's husband died in Jun 04, 2005. Daughter brought in patient. Patient's son has sleep apnea and leg length discrepancy, but has been helping watch over patient since she had vertebral fractures 2 years ago  Patient is suffering from allergies, but is intolerant of antihistamines.  The back pain in right lumbar area is unremitting.  Dr. Randa Evens gave her some injections which did work for [employer].  Last injection was over 9 months ago.  Dr. Noel Gerold thought she had a pinched nerve, but couldn't do surgery because of her age and general condition.  She has finished her two years of [employer].   Objective:  Somewhat fragile appearing woman, NAD HEENT:  Injected conjunctivae, EOMI; oroph:  Clear;  left cerumen impaction cleared with lavage  Lumbar spine:  Very tender across entire lumbar region SLR:  Positive on the right at 80 degrees Reflexes:  Good KJ and AJ  Assessment:  Chronic low back pain with elements of sciatica but no significant nerve damage.  Allergic rhinitis  Plan:   Sciatica neuralgia, right - Plan: Ambulatory referral to Neurology, predniSONE (DELTASONE) 20 MG tablet  Allergic rhinitis - Plan: predniSONE (DELTASONE) 20 MG tablet  Signed,  Elvina Sidle, MD

## 2012-05-19 ENCOUNTER — Telehealth: Payer: Self-pay

## 2012-05-19 NOTE — Telephone Encounter (Signed)
Advised pt to get RF of prednisone to take per Dr L, and transferred her to Lupita Leash to give her details of her referral appt.

## 2012-05-19 NOTE — Telephone Encounter (Signed)
Pt states she sees dr Milus Glazier and he asked her to call in regards to her prednisone. States she was supposed to speak to him about how its working etc so he can decide to rx more or not. Pt is on her last day of the rx.   Best: [phone]  bf

## 2012-05-19 NOTE — Telephone Encounter (Signed)
Called her to see if this is helping. She states it has helped, wants to know if you want her to take any more, advised her we normally use short term, she states she has a refill, do you want her to refill this, please advise. I have asked donna about the referral she will check on this for her. The appt is May 22nd at 845. They are in [facility] they will mail her a new patient package.

## 2012-05-19 NOTE — Telephone Encounter (Signed)
Please refill and keep May 22 appt

## 2012-05-26 DIAGNOSIS — J309 Allergic rhinitis, unspecified: Secondary | ICD-10-CM | POA: Diagnosis not present

## 2012-05-26 DIAGNOSIS — R05 Cough: Secondary | ICD-10-CM | POA: Diagnosis not present

## 2012-07-03 DIAGNOSIS — M8440XA Pathological fracture, unspecified site, initial encounter for fracture: Secondary | ICD-10-CM | POA: Diagnosis not present

## 2012-07-03 DIAGNOSIS — M810 Age-related osteoporosis without current pathological fracture: Secondary | ICD-10-CM | POA: Diagnosis not present

## 2012-07-03 DIAGNOSIS — G8929 Other chronic pain: Secondary | ICD-10-CM | POA: Diagnosis not present

## 2012-07-03 DIAGNOSIS — R109 Unspecified abdominal pain: Secondary | ICD-10-CM | POA: Diagnosis not present

## 2012-07-07 DIAGNOSIS — R109 Unspecified abdominal pain: Secondary | ICD-10-CM | POA: Diagnosis not present

## 2012-07-15 DIAGNOSIS — R109 Unspecified abdominal pain: Secondary | ICD-10-CM | POA: Diagnosis not present

## 2012-07-15 DIAGNOSIS — M810 Age-related osteoporosis without current pathological fracture: Secondary | ICD-10-CM | POA: Diagnosis not present

## 2012-07-15 DIAGNOSIS — M8440XA Pathological fracture, unspecified site, initial encounter for fracture: Secondary | ICD-10-CM | POA: Diagnosis not present

## 2012-07-15 DIAGNOSIS — G8929 Other chronic pain: Secondary | ICD-10-CM | POA: Diagnosis not present

## 2012-07-17 DIAGNOSIS — Z8781 Personal history of (healed) traumatic fracture: Secondary | ICD-10-CM | POA: Diagnosis not present

## 2012-07-17 DIAGNOSIS — M810 Age-related osteoporosis without current pathological fracture: Secondary | ICD-10-CM | POA: Diagnosis not present

## 2012-07-17 DIAGNOSIS — Z79899 Other long term (current) drug therapy: Secondary | ICD-10-CM | POA: Diagnosis not present

## 2012-07-17 DIAGNOSIS — Z130 Encounter for screening for diseases of the blood and blood-forming organs and certain disorders involving the immune mechanism: Secondary | ICD-10-CM | POA: Diagnosis not present

## 2012-07-22 DIAGNOSIS — M810 Age-related osteoporosis without current pathological fracture: Secondary | ICD-10-CM | POA: Diagnosis not present

## 2012-08-01 DIAGNOSIS — Z79899 Other long term (current) drug therapy: Secondary | ICD-10-CM | POA: Diagnosis not present

## 2012-08-01 DIAGNOSIS — M810 Age-related osteoporosis without current pathological fracture: Secondary | ICD-10-CM | POA: Diagnosis not present

## 2012-08-01 DIAGNOSIS — Z8781 Personal history of (healed) traumatic fracture: Secondary | ICD-10-CM | POA: Diagnosis not present

## 2012-09-11 ENCOUNTER — Encounter: Payer: Self-pay | Admitting: Family Medicine

## 2012-09-11 ENCOUNTER — Ambulatory Visit (INDEPENDENT_AMBULATORY_CARE_PROVIDER_SITE_OTHER): Payer: Medicare Other | Admitting: Family Medicine

## 2012-09-11 VITALS — BP 142/76 | HR 70 | Temp 98.8°F | Resp 16 | Ht 60.0 in | Wt 136.4 lb

## 2012-09-11 DIAGNOSIS — M129 Arthropathy, unspecified: Secondary | ICD-10-CM

## 2012-09-11 DIAGNOSIS — J309 Allergic rhinitis, unspecified: Secondary | ICD-10-CM | POA: Diagnosis not present

## 2012-09-11 DIAGNOSIS — M542 Cervicalgia: Secondary | ICD-10-CM

## 2012-09-11 DIAGNOSIS — M1990 Unspecified osteoarthritis, unspecified site: Secondary | ICD-10-CM

## 2012-09-11 DIAGNOSIS — M549 Dorsalgia, unspecified: Secondary | ICD-10-CM | POA: Diagnosis not present

## 2012-09-11 MED ORDER — MOMETASONE FUROATE 50 MCG/ACT NA SUSP: 2.0000 | Freq: Every day | NASAL | Status: AC | PRN

## 2012-09-11 MED ORDER — LIDOCAINE 5 % EX PTCH: 1.0000 | MEDICATED_PATCH | CUTANEOUS | Status: DC
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
## 2012-10-28 DIAGNOSIS — E559 Vitamin D deficiency, unspecified: Secondary | ICD-10-CM | POA: Diagnosis not present

## 2012-10-28 DIAGNOSIS — Z85038 Personal history of other malignant neoplasm of large intestine: Secondary | ICD-10-CM | POA: Diagnosis not present

## 2012-10-28 DIAGNOSIS — Z79899 Other long term (current) drug therapy: Secondary | ICD-10-CM | POA: Diagnosis not present

## 2012-10-28 DIAGNOSIS — J309 Allergic rhinitis, unspecified: Secondary | ICD-10-CM | POA: Diagnosis not present

## 2012-10-28 DIAGNOSIS — I10 Essential (primary) hypertension: Secondary | ICD-10-CM | POA: Diagnosis not present

## 2012-10-28 DIAGNOSIS — M810 Age-related osteoporosis without current pathological fracture: Secondary | ICD-10-CM | POA: Diagnosis not present

## 2012-11-18 DIAGNOSIS — E875 Hyperkalemia: Secondary | ICD-10-CM | POA: Diagnosis not present

## 2012-11-18 DIAGNOSIS — M545 Low back pain: Secondary | ICD-10-CM | POA: Diagnosis not present

## 2012-11-18 DIAGNOSIS — IMO0002 Reserved for concepts with insufficient information to code with codable children: Secondary | ICD-10-CM | POA: Diagnosis not present

## 2012-11-18 DIAGNOSIS — J309 Allergic rhinitis, unspecified: Secondary | ICD-10-CM | POA: Diagnosis not present

## 2012-11-18 DIAGNOSIS — C189 Malignant neoplasm of colon, unspecified: Secondary | ICD-10-CM | POA: Diagnosis not present

## 2012-11-21 DIAGNOSIS — Q619 Cystic kidney disease, unspecified: Secondary | ICD-10-CM | POA: Diagnosis not present

## 2012-12-18 DIAGNOSIS — E875 Hyperkalemia: Secondary | ICD-10-CM | POA: Diagnosis not present

## 2012-12-18 DIAGNOSIS — I10 Essential (primary) hypertension: Secondary | ICD-10-CM | POA: Diagnosis not present

## 2012-12-18 DIAGNOSIS — M545 Low back pain: Secondary | ICD-10-CM | POA: Diagnosis not present

## 2012-12-18 DIAGNOSIS — J309 Allergic rhinitis, unspecified: Secondary | ICD-10-CM | POA: Diagnosis not present

## 2013-02-18 DIAGNOSIS — J449 Chronic obstructive pulmonary disease, unspecified: Secondary | ICD-10-CM | POA: Diagnosis not present

## 2013-02-18 DIAGNOSIS — Z79899 Other long term (current) drug therapy: Secondary | ICD-10-CM | POA: Diagnosis not present

## 2013-02-18 DIAGNOSIS — M810 Age-related osteoporosis without current pathological fracture: Secondary | ICD-10-CM | POA: Diagnosis not present

## 2013-02-18 DIAGNOSIS — R1900 Intra-abdominal and pelvic swelling, mass and lump, unspecified site: Secondary | ICD-10-CM | POA: Diagnosis not present

## 2013-02-18 DIAGNOSIS — I10 Essential (primary) hypertension: Secondary | ICD-10-CM | POA: Diagnosis not present

## 2013-02-20 DIAGNOSIS — R634 Abnormal weight loss: Secondary | ICD-10-CM | POA: Diagnosis not present

## 2013-02-20 DIAGNOSIS — C180 Malignant neoplasm of cecum: Secondary | ICD-10-CM | POA: Diagnosis not present

## 2013-02-20 DIAGNOSIS — R1900 Intra-abdominal and pelvic swelling, mass and lump, unspecified site: Secondary | ICD-10-CM | POA: Diagnosis not present

## 2013-02-20 DIAGNOSIS — N281 Cyst of kidney, acquired: Secondary | ICD-10-CM | POA: Diagnosis not present

## 2013-02-27 DIAGNOSIS — K869 Disease of pancreas, unspecified: Secondary | ICD-10-CM | POA: Diagnosis not present

## 2013-02-27 DIAGNOSIS — Q619 Cystic kidney disease, unspecified: Secondary | ICD-10-CM | POA: Diagnosis not present

## 2013-02-27 DIAGNOSIS — K7689 Other specified diseases of liver: Secondary | ICD-10-CM | POA: Diagnosis not present

## 2013-03-10 DIAGNOSIS — K869 Disease of pancreas, unspecified: Secondary | ICD-10-CM | POA: Diagnosis not present

## 2013-03-10 DIAGNOSIS — C189 Malignant neoplasm of colon, unspecified: Secondary | ICD-10-CM | POA: Diagnosis not present

## 2013-03-10 DIAGNOSIS — IMO0002 Reserved for concepts with insufficient information to code with codable children: Secondary | ICD-10-CM | POA: Diagnosis not present

## 2013-03-10 DIAGNOSIS — Q619 Cystic kidney disease, unspecified: Secondary | ICD-10-CM | POA: Diagnosis not present

## 2013-03-24 DIAGNOSIS — Z85038 Personal history of other malignant neoplasm of large intestine: Secondary | ICD-10-CM | POA: Diagnosis not present

## 2013-03-24 DIAGNOSIS — R1011 Right upper quadrant pain: Secondary | ICD-10-CM | POA: Diagnosis not present

## 2013-04-06 DIAGNOSIS — Z85038 Personal history of other malignant neoplasm of large intestine: Secondary | ICD-10-CM | POA: Diagnosis not present

## 2013-04-06 DIAGNOSIS — K5730 Diverticulosis of large intestine without perforation or abscess without bleeding: Secondary | ICD-10-CM | POA: Diagnosis not present

## 2013-04-20 DIAGNOSIS — M949 Disorder of cartilage, unspecified: Secondary | ICD-10-CM | POA: Diagnosis not present

## 2013-04-20 DIAGNOSIS — M899 Disorder of bone, unspecified: Secondary | ICD-10-CM | POA: Diagnosis not present

## 2013-04-20 DIAGNOSIS — J309 Allergic rhinitis, unspecified: Secondary | ICD-10-CM | POA: Diagnosis not present

## 2013-04-20 DIAGNOSIS — I10 Essential (primary) hypertension: Secondary | ICD-10-CM | POA: Diagnosis not present

## 2013-04-20 DIAGNOSIS — M810 Age-related osteoporosis without current pathological fracture: Secondary | ICD-10-CM | POA: Diagnosis not present

## 2013-04-20 DIAGNOSIS — J45909 Unspecified asthma, uncomplicated: Secondary | ICD-10-CM | POA: Diagnosis not present

## 2013-05-08 DIAGNOSIS — R059 Cough, unspecified: Secondary | ICD-10-CM | POA: Diagnosis not present

## 2013-05-08 DIAGNOSIS — J309 Allergic rhinitis, unspecified: Secondary | ICD-10-CM | POA: Diagnosis not present

## 2013-05-08 DIAGNOSIS — R05 Cough: Secondary | ICD-10-CM | POA: Diagnosis not present

## 2013-05-08 DIAGNOSIS — J441 Chronic obstructive pulmonary disease with (acute) exacerbation: Secondary | ICD-10-CM | POA: Diagnosis not present

## 2013-05-14 DIAGNOSIS — J309 Allergic rhinitis, unspecified: Secondary | ICD-10-CM | POA: Diagnosis not present

## 2013-05-14 DIAGNOSIS — IMO0002 Reserved for concepts with insufficient information to code with codable children: Secondary | ICD-10-CM | POA: Diagnosis not present

## 2013-05-14 DIAGNOSIS — H1045 Other chronic allergic conjunctivitis: Secondary | ICD-10-CM | POA: Diagnosis not present

## 2013-07-09 DIAGNOSIS — H26499 Other secondary cataract, unspecified eye: Secondary | ICD-10-CM | POA: Diagnosis not present

## 2013-07-20 DIAGNOSIS — H1045 Other chronic allergic conjunctivitis: Secondary | ICD-10-CM | POA: Diagnosis not present

## 2013-07-20 DIAGNOSIS — K130 Diseases of lips: Secondary | ICD-10-CM | POA: Diagnosis not present

## 2013-07-20 DIAGNOSIS — J309 Allergic rhinitis, unspecified: Secondary | ICD-10-CM | POA: Diagnosis not present

## 2013-07-20 DIAGNOSIS — J449 Chronic obstructive pulmonary disease, unspecified: Secondary | ICD-10-CM | POA: Diagnosis not present

## 2013-10-20 DIAGNOSIS — Z23 Encounter for immunization: Secondary | ICD-10-CM | POA: Diagnosis not present

## 2013-10-20 DIAGNOSIS — E559 Vitamin D deficiency, unspecified: Secondary | ICD-10-CM | POA: Diagnosis not present

## 2013-10-20 DIAGNOSIS — J309 Allergic rhinitis, unspecified: Secondary | ICD-10-CM | POA: Diagnosis not present

## 2013-10-20 DIAGNOSIS — I10 Essential (primary) hypertension: Secondary | ICD-10-CM | POA: Diagnosis not present

## 2013-10-20 DIAGNOSIS — Z79899 Other long term (current) drug therapy: Secondary | ICD-10-CM | POA: Diagnosis not present

## 2013-10-20 DIAGNOSIS — E785 Hyperlipidemia, unspecified: Secondary | ICD-10-CM | POA: Diagnosis not present

## 2013-10-20 DIAGNOSIS — Z0000 Encounter for general adult medical examination without abnormal findings: Secondary | ICD-10-CM | POA: Diagnosis not present

## 2013-10-20 DIAGNOSIS — Z780 Asymptomatic menopausal state: Secondary | ICD-10-CM | POA: Diagnosis not present

## 2013-10-22 DIAGNOSIS — M810 Age-related osteoporosis without current pathological fracture: Secondary | ICD-10-CM | POA: Diagnosis not present

## 2013-10-22 DIAGNOSIS — Z1211 Encounter for screening for malignant neoplasm of colon: Secondary | ICD-10-CM | POA: Diagnosis not present

## 2013-10-27 DIAGNOSIS — Z1231 Encounter for screening mammogram for malignant neoplasm of breast: Secondary | ICD-10-CM | POA: Diagnosis not present

## 2013-11-03 DIAGNOSIS — L821 Other seborrheic keratosis: Secondary | ICD-10-CM | POA: Diagnosis not present

## 2013-11-03 DIAGNOSIS — L570 Actinic keratosis: Secondary | ICD-10-CM | POA: Diagnosis not present

## 2013-11-19 DIAGNOSIS — J309 Allergic rhinitis, unspecified: Secondary | ICD-10-CM | POA: Diagnosis not present

## 2013-11-19 DIAGNOSIS — E785 Hyperlipidemia, unspecified: Secondary | ICD-10-CM | POA: Diagnosis not present

## 2013-11-19 DIAGNOSIS — Z1389 Encounter for screening for other disorder: Secondary | ICD-10-CM | POA: Diagnosis not present

## 2013-11-19 DIAGNOSIS — M810 Age-related osteoporosis without current pathological fracture: Secondary | ICD-10-CM | POA: Diagnosis not present

## 2013-11-19 DIAGNOSIS — Z23 Encounter for immunization: Secondary | ICD-10-CM | POA: Diagnosis not present

## 2013-11-19 DIAGNOSIS — Q6100 Congenital renal cyst, unspecified: Secondary | ICD-10-CM | POA: Diagnosis not present

## 2013-11-19 DIAGNOSIS — Z9181 History of falling: Secondary | ICD-10-CM | POA: Diagnosis not present

## 2013-11-19 DIAGNOSIS — E559 Vitamin D deficiency, unspecified: Secondary | ICD-10-CM | POA: Diagnosis not present

## 2013-12-07 DIAGNOSIS — K869 Disease of pancreas, unspecified: Secondary | ICD-10-CM | POA: Diagnosis not present

## 2013-12-11 DIAGNOSIS — W230XXA Caught, crushed, jammed, or pinched between moving objects, initial encounter: Secondary | ICD-10-CM | POA: Diagnosis not present

## 2013-12-11 DIAGNOSIS — E780 Pure hypercholesterolemia: Secondary | ICD-10-CM | POA: Diagnosis not present

## 2013-12-11 DIAGNOSIS — S6992XA Unspecified injury of left wrist, hand and finger(s), initial encounter: Secondary | ICD-10-CM | POA: Diagnosis not present

## 2014-02-18 DIAGNOSIS — E785 Hyperlipidemia, unspecified: Secondary | ICD-10-CM | POA: Diagnosis not present

## 2014-02-18 DIAGNOSIS — J449 Chronic obstructive pulmonary disease, unspecified: Secondary | ICD-10-CM | POA: Diagnosis not present

## 2014-02-18 DIAGNOSIS — M792 Neuralgia and neuritis, unspecified: Secondary | ICD-10-CM | POA: Diagnosis not present

## 2014-02-18 DIAGNOSIS — E559 Vitamin D deficiency, unspecified: Secondary | ICD-10-CM | POA: Diagnosis not present

## 2014-02-18 DIAGNOSIS — Z79899 Other long term (current) drug therapy: Secondary | ICD-10-CM | POA: Diagnosis not present

## 2014-02-18 DIAGNOSIS — J309 Allergic rhinitis, unspecified: Secondary | ICD-10-CM | POA: Diagnosis not present

## 2014-02-18 DIAGNOSIS — Z681 Body mass index (BMI) 19 or less, adult: Secondary | ICD-10-CM | POA: Diagnosis not present

## 2014-02-18 DIAGNOSIS — I10 Essential (primary) hypertension: Secondary | ICD-10-CM | POA: Diagnosis not present

## 2014-03-09 IMAGING — CR DG KNEE COMPLETE 4+V*R*
4 series · 4 of 4 positions shown · non-contrast
Comparison: None

CLINICAL DATA: Knee pain and and swelling, injury/fall 1 month ago

RIGHT KNEE - COMPLETE 4+ VIEW

[AP]
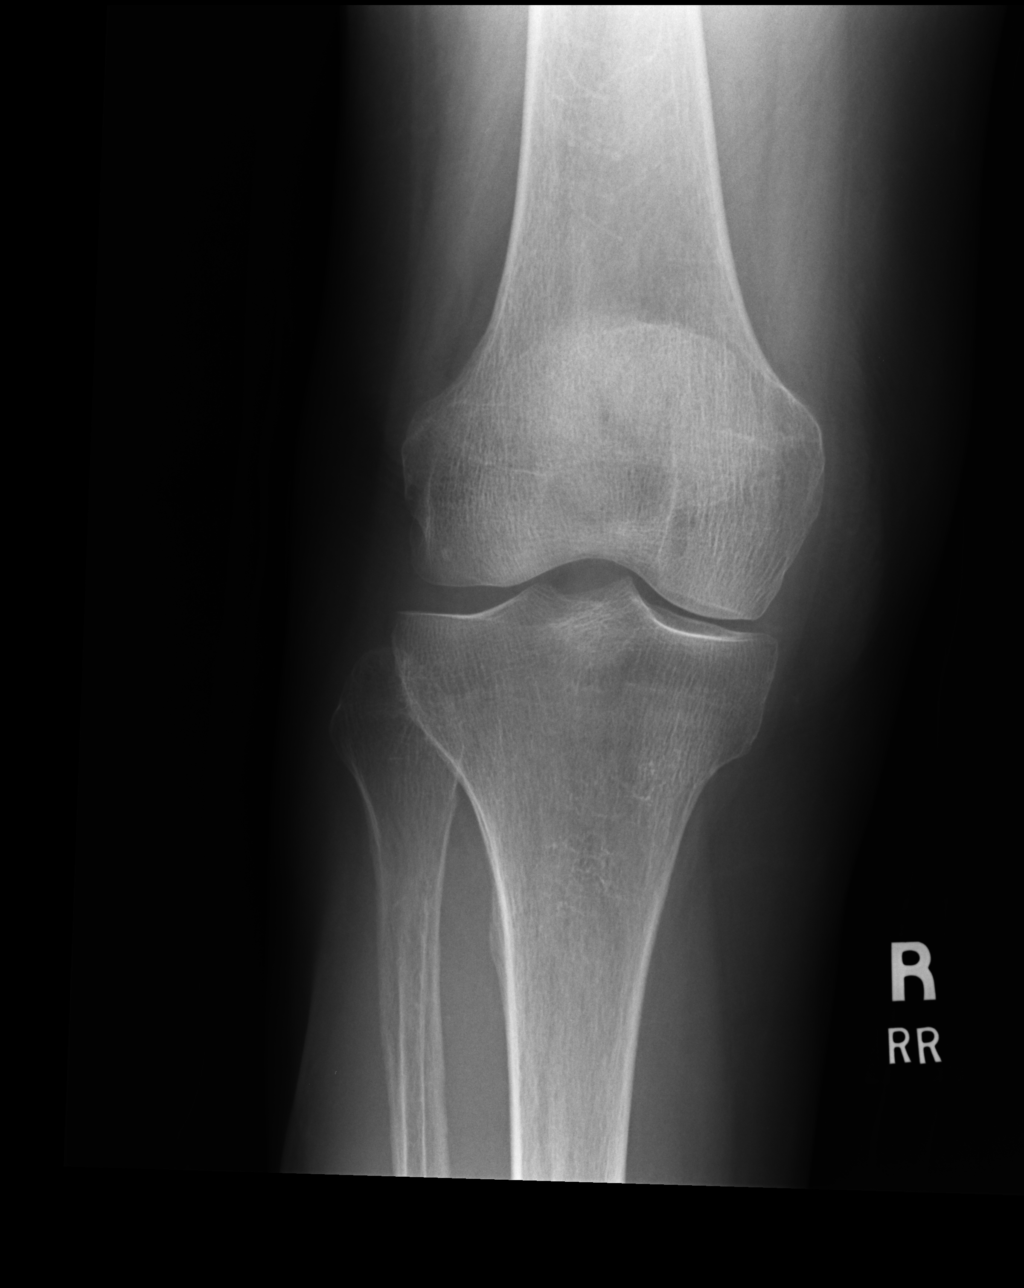

[lateral]
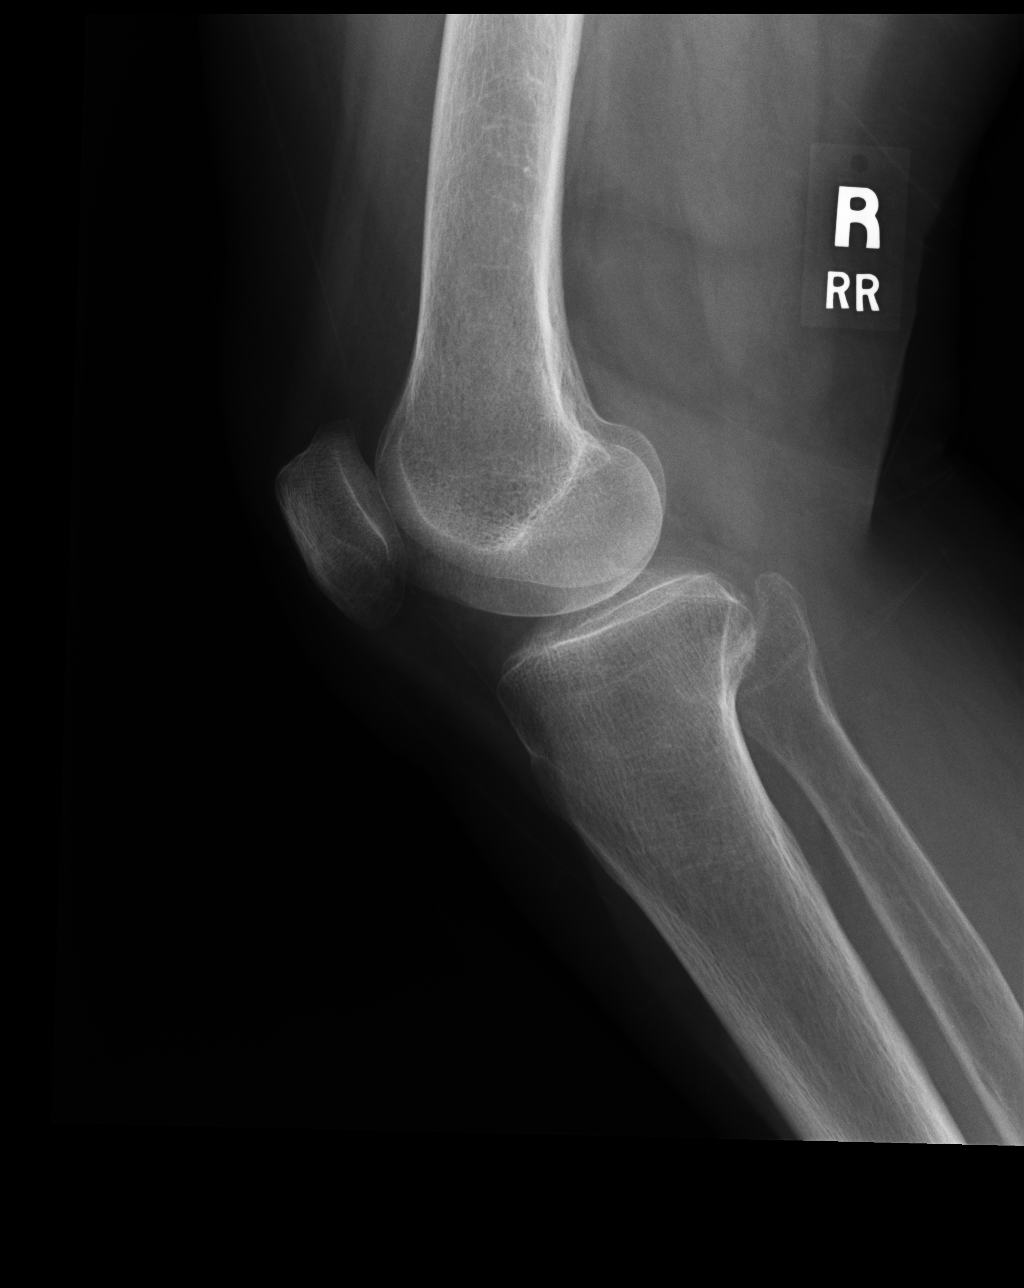

[pa axial]
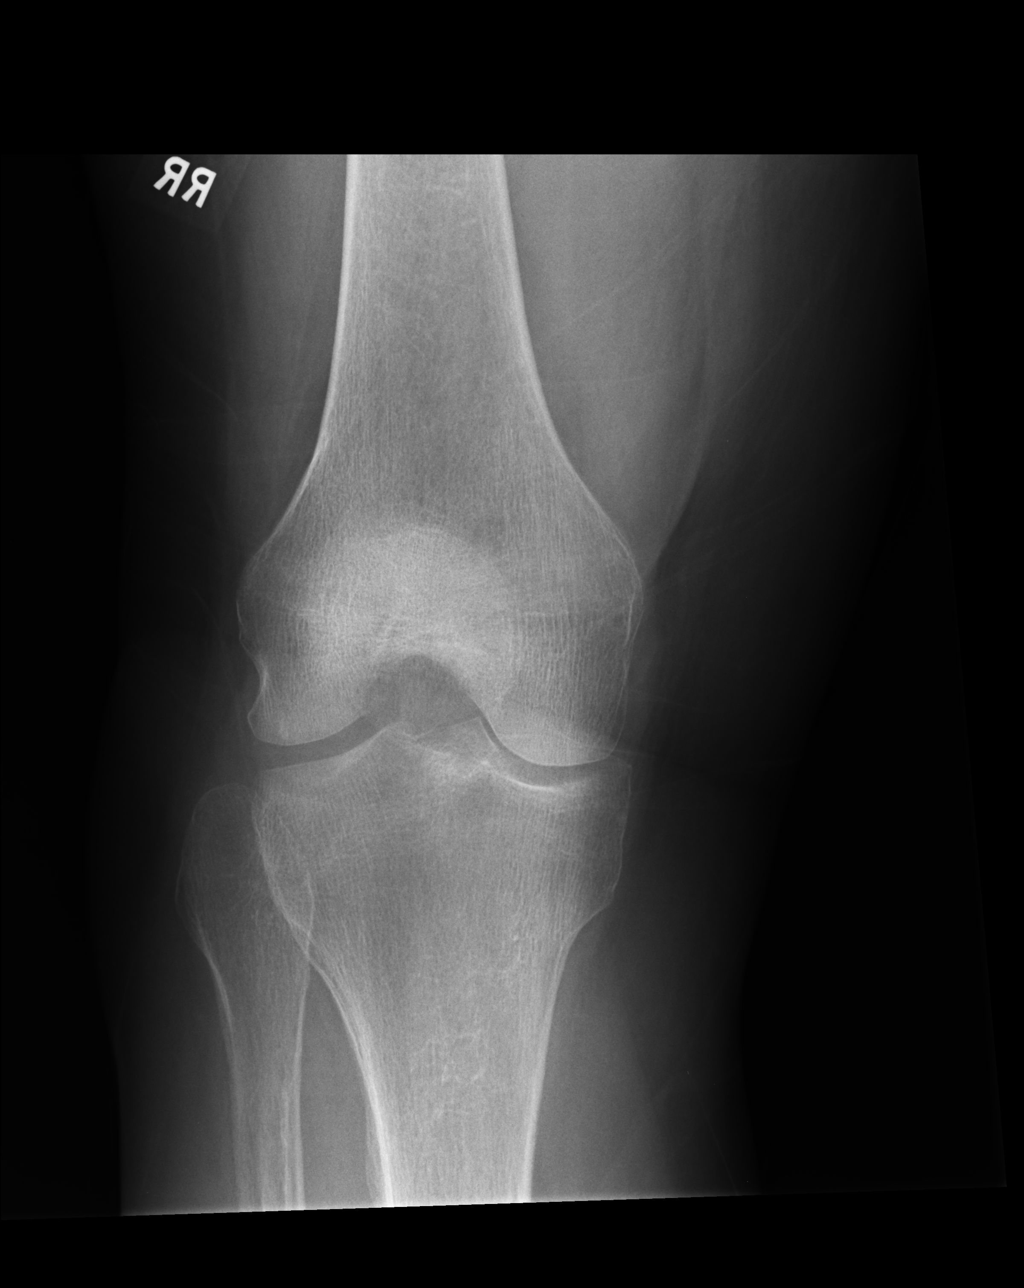

[sunrise]
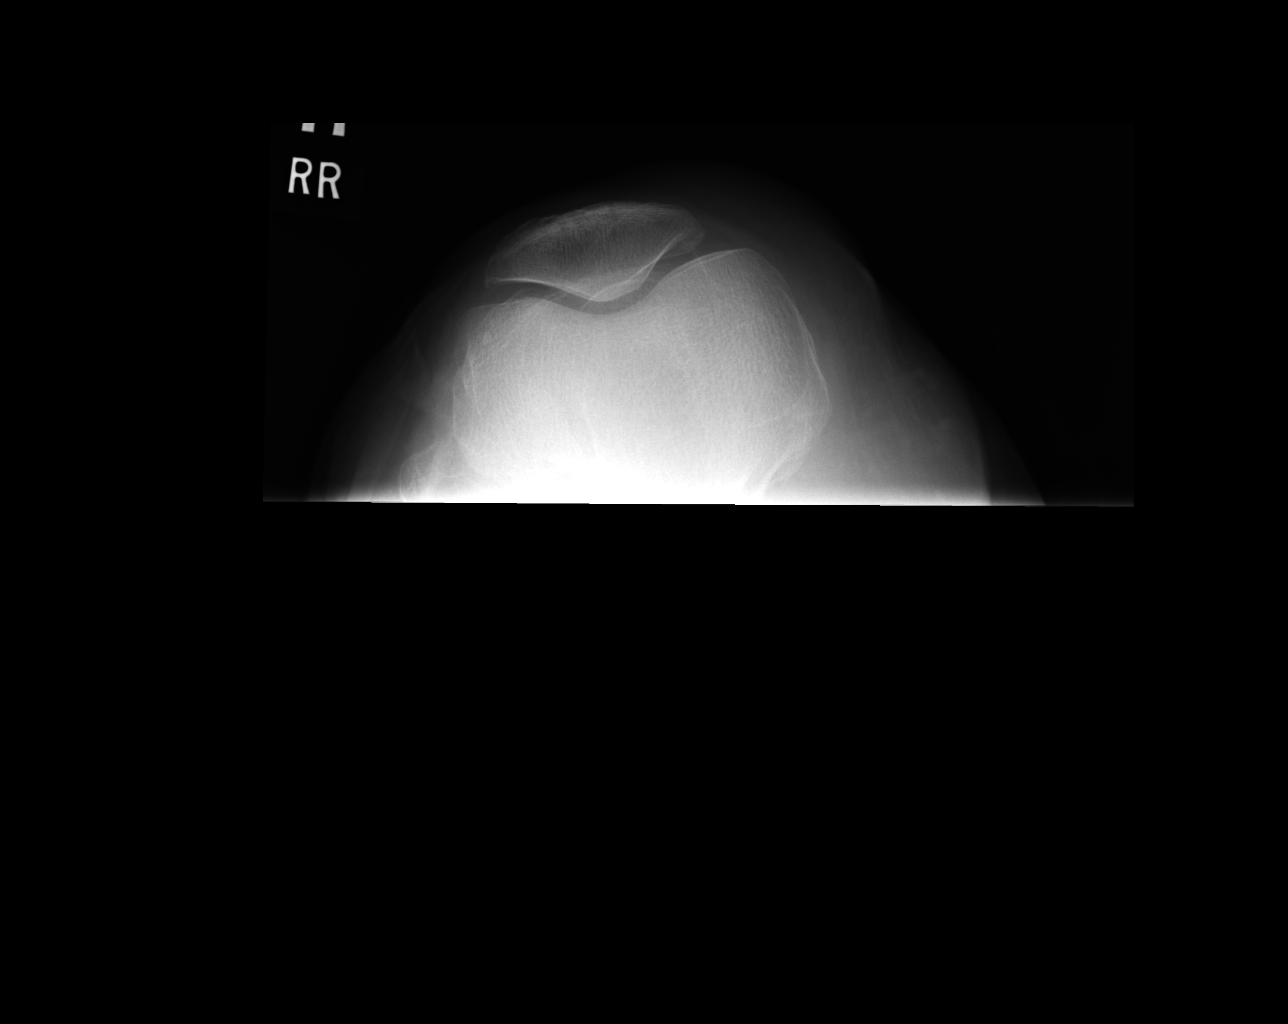

[4 of 4 positions shown; findings below may reference images not displayed]

FINDINGS: Osseous demineralization.
Medial compartment joint space narrowing.
No acute fracture, dislocation or bone destruction.
No knee joint effusion.
IMPRESSION: Osseous demineralization and mild degenerative changes without
acute abnormality.

Clinically significant discrepancy from primary report, if
provided: None

## 2014-04-29 DIAGNOSIS — Z9181 History of falling: Secondary | ICD-10-CM | POA: Diagnosis not present

## 2014-04-29 DIAGNOSIS — J309 Allergic rhinitis, unspecified: Secondary | ICD-10-CM | POA: Diagnosis not present

## 2014-04-29 DIAGNOSIS — Z79899 Other long term (current) drug therapy: Secondary | ICD-10-CM | POA: Diagnosis not present

## 2014-04-29 DIAGNOSIS — M792 Neuralgia and neuritis, unspecified: Secondary | ICD-10-CM | POA: Diagnosis not present

## 2014-04-29 DIAGNOSIS — Z1389 Encounter for screening for other disorder: Secondary | ICD-10-CM | POA: Diagnosis not present

## 2014-04-29 DIAGNOSIS — Z681 Body mass index (BMI) 19 or less, adult: Secondary | ICD-10-CM | POA: Diagnosis not present

## 2014-04-29 DIAGNOSIS — H6120 Impacted cerumen, unspecified ear: Secondary | ICD-10-CM | POA: Diagnosis not present

## 2014-04-29 DIAGNOSIS — M810 Age-related osteoporosis without current pathological fracture: Secondary | ICD-10-CM | POA: Diagnosis not present

## 2014-07-06 DIAGNOSIS — K117 Disturbances of salivary secretion: Secondary | ICD-10-CM | POA: Diagnosis not present

## 2014-07-29 DIAGNOSIS — J309 Allergic rhinitis, unspecified: Secondary | ICD-10-CM | POA: Diagnosis not present

## 2014-07-29 DIAGNOSIS — M545 Low back pain: Secondary | ICD-10-CM | POA: Diagnosis not present

## 2014-07-29 DIAGNOSIS — K140 Glossitis: Secondary | ICD-10-CM | POA: Diagnosis not present

## 2014-07-29 DIAGNOSIS — Z1389 Encounter for screening for other disorder: Secondary | ICD-10-CM | POA: Diagnosis not present

## 2014-07-29 DIAGNOSIS — I10 Essential (primary) hypertension: Secondary | ICD-10-CM | POA: Diagnosis not present

## 2014-07-29 DIAGNOSIS — Z681 Body mass index (BMI) 19 or less, adult: Secondary | ICD-10-CM | POA: Diagnosis not present

## 2014-07-29 DIAGNOSIS — M810 Age-related osteoporosis without current pathological fracture: Secondary | ICD-10-CM | POA: Diagnosis not present

## 2014-09-02 DIAGNOSIS — Z6822 Body mass index (BMI) 22.0-22.9, adult: Secondary | ICD-10-CM | POA: Diagnosis not present

## 2014-09-02 DIAGNOSIS — K529 Noninfective gastroenteritis and colitis, unspecified: Secondary | ICD-10-CM | POA: Diagnosis not present

## 2014-09-02 DIAGNOSIS — J029 Acute pharyngitis, unspecified: Secondary | ICD-10-CM | POA: Diagnosis not present

## 2014-09-02 DIAGNOSIS — E559 Vitamin D deficiency, unspecified: Secondary | ICD-10-CM | POA: Diagnosis not present

## 2014-09-02 DIAGNOSIS — J209 Acute bronchitis, unspecified: Secondary | ICD-10-CM | POA: Diagnosis not present

## 2014-11-03 DIAGNOSIS — E559 Vitamin D deficiency, unspecified: Secondary | ICD-10-CM | POA: Diagnosis not present

## 2014-11-03 DIAGNOSIS — E785 Hyperlipidemia, unspecified: Secondary | ICD-10-CM | POA: Diagnosis not present

## 2014-11-03 DIAGNOSIS — Z1212 Encounter for screening for malignant neoplasm of rectum: Secondary | ICD-10-CM | POA: Diagnosis not present

## 2014-11-03 DIAGNOSIS — Z79899 Other long term (current) drug therapy: Secondary | ICD-10-CM | POA: Diagnosis not present

## 2014-11-03 DIAGNOSIS — I10 Essential (primary) hypertension: Secondary | ICD-10-CM | POA: Diagnosis not present

## 2014-11-03 DIAGNOSIS — Z0000 Encounter for general adult medical examination without abnormal findings: Secondary | ICD-10-CM | POA: Diagnosis not present

## 2014-11-03 DIAGNOSIS — Z1231 Encounter for screening mammogram for malignant neoplasm of breast: Secondary | ICD-10-CM | POA: Diagnosis not present

## 2014-11-03 DIAGNOSIS — M8589 Other specified disorders of bone density and structure, multiple sites: Secondary | ICD-10-CM | POA: Diagnosis not present

## 2014-11-03 DIAGNOSIS — M1990 Unspecified osteoarthritis, unspecified site: Secondary | ICD-10-CM | POA: Diagnosis not present

## 2014-11-03 DIAGNOSIS — M810 Age-related osteoporosis without current pathological fracture: Secondary | ICD-10-CM | POA: Diagnosis not present

## 2014-11-03 DIAGNOSIS — Z23 Encounter for immunization: Secondary | ICD-10-CM | POA: Diagnosis not present

## 2014-11-03 DIAGNOSIS — J309 Allergic rhinitis, unspecified: Secondary | ICD-10-CM | POA: Diagnosis not present

## 2014-11-22 DIAGNOSIS — M8589 Other specified disorders of bone density and structure, multiple sites: Secondary | ICD-10-CM | POA: Diagnosis not present

## 2014-11-22 DIAGNOSIS — Z9882 Breast implant status: Secondary | ICD-10-CM | POA: Diagnosis not present

## 2014-11-22 DIAGNOSIS — Z1231 Encounter for screening mammogram for malignant neoplasm of breast: Secondary | ICD-10-CM | POA: Diagnosis not present

## 2014-11-22 DIAGNOSIS — M8588 Other specified disorders of bone density and structure, other site: Secondary | ICD-10-CM | POA: Diagnosis not present

## 2015-01-07 DIAGNOSIS — Z6823 Body mass index (BMI) 23.0-23.9, adult: Secondary | ICD-10-CM | POA: Diagnosis not present

## 2015-01-07 DIAGNOSIS — J209 Acute bronchitis, unspecified: Secondary | ICD-10-CM | POA: Diagnosis not present

## 2015-02-28 DIAGNOSIS — Z79899 Other long term (current) drug therapy: Secondary | ICD-10-CM | POA: Diagnosis not present

## 2015-03-03 DIAGNOSIS — J209 Acute bronchitis, unspecified: Secondary | ICD-10-CM | POA: Diagnosis not present

## 2015-03-03 DIAGNOSIS — J019 Acute sinusitis, unspecified: Secondary | ICD-10-CM | POA: Diagnosis not present

## 2015-03-03 DIAGNOSIS — R6889 Other general symptoms and signs: Secondary | ICD-10-CM | POA: Diagnosis not present

## 2015-03-03 DIAGNOSIS — Z6823 Body mass index (BMI) 23.0-23.9, adult: Secondary | ICD-10-CM | POA: Diagnosis not present

## 2015-03-08 DIAGNOSIS — M810 Age-related osteoporosis without current pathological fracture: Secondary | ICD-10-CM | POA: Diagnosis not present

## 2015-03-08 DIAGNOSIS — I10 Essential (primary) hypertension: Secondary | ICD-10-CM | POA: Diagnosis not present

## 2015-03-08 DIAGNOSIS — Z6823 Body mass index (BMI) 23.0-23.9, adult: Secondary | ICD-10-CM | POA: Diagnosis not present

## 2015-03-08 DIAGNOSIS — J309 Allergic rhinitis, unspecified: Secondary | ICD-10-CM | POA: Diagnosis not present

## 2015-03-08 DIAGNOSIS — E785 Hyperlipidemia, unspecified: Secondary | ICD-10-CM | POA: Diagnosis not present

## 2015-03-08 DIAGNOSIS — E559 Vitamin D deficiency, unspecified: Secondary | ICD-10-CM | POA: Diagnosis not present

## 2015-03-08 DIAGNOSIS — M545 Low back pain: Secondary | ICD-10-CM | POA: Diagnosis not present

## 2015-03-08 DIAGNOSIS — J449 Chronic obstructive pulmonary disease, unspecified: Secondary | ICD-10-CM | POA: Diagnosis not present

## 2015-04-14 ENCOUNTER — Ambulatory Visit: Payer: Medicare Other | Admitting: Allergy and Immunology

## 2015-06-07 DIAGNOSIS — H5213 Myopia, bilateral: Secondary | ICD-10-CM | POA: Diagnosis not present

## 2015-06-07 DIAGNOSIS — H01009 Unspecified blepharitis unspecified eye, unspecified eyelid: Secondary | ICD-10-CM | POA: Diagnosis not present

## 2015-06-07 DIAGNOSIS — H353130 Nonexudative age-related macular degeneration, bilateral, stage unspecified: Secondary | ICD-10-CM | POA: Diagnosis not present

## 2015-09-06 DIAGNOSIS — Z79899 Other long term (current) drug therapy: Secondary | ICD-10-CM | POA: Diagnosis not present

## 2015-09-06 DIAGNOSIS — J309 Allergic rhinitis, unspecified: Secondary | ICD-10-CM | POA: Diagnosis not present

## 2015-09-06 DIAGNOSIS — E785 Hyperlipidemia, unspecified: Secondary | ICD-10-CM | POA: Diagnosis not present

## 2015-09-06 DIAGNOSIS — Z1389 Encounter for screening for other disorder: Secondary | ICD-10-CM | POA: Diagnosis not present

## 2015-09-06 DIAGNOSIS — Z9181 History of falling: Secondary | ICD-10-CM | POA: Diagnosis not present

## 2015-09-06 DIAGNOSIS — E559 Vitamin D deficiency, unspecified: Secondary | ICD-10-CM | POA: Diagnosis not present

## 2015-09-06 DIAGNOSIS — H04129 Dry eye syndrome of unspecified lacrimal gland: Secondary | ICD-10-CM | POA: Diagnosis not present

## 2015-09-06 DIAGNOSIS — M810 Age-related osteoporosis without current pathological fracture: Secondary | ICD-10-CM | POA: Diagnosis not present

## 2015-09-06 DIAGNOSIS — I10 Essential (primary) hypertension: Secondary | ICD-10-CM | POA: Diagnosis not present

## 2015-09-06 DIAGNOSIS — Z6824 Body mass index (BMI) 24.0-24.9, adult: Secondary | ICD-10-CM | POA: Diagnosis not present

## 2015-09-07 DIAGNOSIS — M810 Age-related osteoporosis without current pathological fracture: Secondary | ICD-10-CM | POA: Diagnosis not present

## 2015-09-07 DIAGNOSIS — H01009 Unspecified blepharitis unspecified eye, unspecified eyelid: Secondary | ICD-10-CM | POA: Diagnosis not present

## 2015-10-12 DIAGNOSIS — H01009 Unspecified blepharitis unspecified eye, unspecified eyelid: Secondary | ICD-10-CM | POA: Diagnosis not present

## 2015-10-26 DIAGNOSIS — Z23 Encounter for immunization: Secondary | ICD-10-CM | POA: Diagnosis not present

## 2016-02-06 DIAGNOSIS — Z79899 Other long term (current) drug therapy: Secondary | ICD-10-CM | POA: Diagnosis not present

## 2016-02-06 DIAGNOSIS — J449 Chronic obstructive pulmonary disease, unspecified: Secondary | ICD-10-CM | POA: Diagnosis not present

## 2016-02-06 DIAGNOSIS — J309 Allergic rhinitis, unspecified: Secondary | ICD-10-CM | POA: Diagnosis not present

## 2016-02-06 DIAGNOSIS — Z6823 Body mass index (BMI) 23.0-23.9, adult: Secondary | ICD-10-CM | POA: Diagnosis not present

## 2016-02-06 DIAGNOSIS — M792 Neuralgia and neuritis, unspecified: Secondary | ICD-10-CM | POA: Diagnosis not present

## 2016-02-06 DIAGNOSIS — I10 Essential (primary) hypertension: Secondary | ICD-10-CM | POA: Diagnosis not present

## 2016-02-06 DIAGNOSIS — C189 Malignant neoplasm of colon, unspecified: Secondary | ICD-10-CM | POA: Diagnosis not present

## 2016-02-06 DIAGNOSIS — Z1231 Encounter for screening mammogram for malignant neoplasm of breast: Secondary | ICD-10-CM | POA: Diagnosis not present

## 2016-02-06 DIAGNOSIS — E785 Hyperlipidemia, unspecified: Secondary | ICD-10-CM | POA: Diagnosis not present

## 2016-03-13 DIAGNOSIS — M810 Age-related osteoporosis without current pathological fracture: Secondary | ICD-10-CM | POA: Diagnosis not present

## 2016-03-23 DIAGNOSIS — Z1231 Encounter for screening mammogram for malignant neoplasm of breast: Secondary | ICD-10-CM | POA: Diagnosis not present

## 2016-04-25 DIAGNOSIS — Z0000 Encounter for general adult medical examination without abnormal findings: Secondary | ICD-10-CM | POA: Diagnosis not present

## 2016-04-25 DIAGNOSIS — E785 Hyperlipidemia, unspecified: Secondary | ICD-10-CM | POA: Diagnosis not present

## 2016-04-25 DIAGNOSIS — N959 Unspecified menopausal and perimenopausal disorder: Secondary | ICD-10-CM | POA: Diagnosis not present

## 2016-04-25 DIAGNOSIS — Z23 Encounter for immunization: Secondary | ICD-10-CM | POA: Diagnosis not present

## 2016-04-25 DIAGNOSIS — Z136 Encounter for screening for cardiovascular disorders: Secondary | ICD-10-CM | POA: Diagnosis not present

## 2016-11-08 DIAGNOSIS — Z8262 Family history of osteoporosis: Secondary | ICD-10-CM | POA: Diagnosis not present

## 2016-11-08 DIAGNOSIS — R29890 Loss of height: Secondary | ICD-10-CM | POA: Diagnosis not present

## 2016-11-08 DIAGNOSIS — Z79899 Other long term (current) drug therapy: Secondary | ICD-10-CM | POA: Diagnosis not present

## 2016-11-08 DIAGNOSIS — Z1321 Encounter for screening for nutritional disorder: Secondary | ICD-10-CM | POA: Diagnosis not present

## 2016-11-08 DIAGNOSIS — Z8781 Personal history of (healed) traumatic fracture: Secondary | ICD-10-CM | POA: Diagnosis not present

## 2016-11-08 DIAGNOSIS — M818 Other osteoporosis without current pathological fracture: Secondary | ICD-10-CM | POA: Diagnosis not present

## 2016-11-19 DIAGNOSIS — E785 Hyperlipidemia, unspecified: Secondary | ICD-10-CM | POA: Insufficient documentation

## 2016-11-19 DIAGNOSIS — R030 Elevated blood-pressure reading, without diagnosis of hypertension: Secondary | ICD-10-CM | POA: Diagnosis not present

## 2016-11-19 DIAGNOSIS — L720 Epidermal cyst: Secondary | ICD-10-CM | POA: Diagnosis not present

## 2016-11-19 DIAGNOSIS — M545 Low back pain: Secondary | ICD-10-CM | POA: Diagnosis not present

## 2016-11-19 DIAGNOSIS — M818 Other osteoporosis without current pathological fracture: Secondary | ICD-10-CM | POA: Diagnosis not present

## 2016-11-19 DIAGNOSIS — J449 Chronic obstructive pulmonary disease, unspecified: Secondary | ICD-10-CM | POA: Diagnosis not present

## 2016-11-23 DIAGNOSIS — M5441 Lumbago with sciatica, right side: Secondary | ICD-10-CM | POA: Diagnosis not present

## 2016-11-23 DIAGNOSIS — M818 Other osteoporosis without current pathological fracture: Secondary | ICD-10-CM | POA: Diagnosis not present

## 2016-11-23 DIAGNOSIS — R52 Pain, unspecified: Secondary | ICD-10-CM | POA: Diagnosis not present

## 2016-11-27 DIAGNOSIS — D485 Neoplasm of uncertain behavior of skin: Secondary | ICD-10-CM | POA: Diagnosis not present

## 2016-11-27 DIAGNOSIS — L720 Epidermal cyst: Secondary | ICD-10-CM | POA: Diagnosis not present

## 2016-11-27 DIAGNOSIS — L570 Actinic keratosis: Secondary | ICD-10-CM | POA: Diagnosis not present

## 2016-12-03 DIAGNOSIS — I10 Essential (primary) hypertension: Secondary | ICD-10-CM | POA: Insufficient documentation

## 2016-12-03 DIAGNOSIS — Z23 Encounter for immunization: Secondary | ICD-10-CM | POA: Diagnosis not present

## 2016-12-04 DIAGNOSIS — R011 Cardiac murmur, unspecified: Secondary | ICD-10-CM | POA: Diagnosis not present

## 2016-12-04 DIAGNOSIS — R55 Syncope and collapse: Secondary | ICD-10-CM | POA: Diagnosis not present

## 2016-12-13 DIAGNOSIS — I951 Orthostatic hypotension: Secondary | ICD-10-CM | POA: Diagnosis not present

## 2016-12-13 DIAGNOSIS — R51 Headache: Secondary | ICD-10-CM | POA: Diagnosis not present

## 2016-12-13 DIAGNOSIS — I10 Essential (primary) hypertension: Secondary | ICD-10-CM | POA: Diagnosis not present

## 2016-12-13 DIAGNOSIS — R55 Syncope and collapse: Secondary | ICD-10-CM | POA: Diagnosis not present

## 2016-12-13 DIAGNOSIS — I517 Cardiomegaly: Secondary | ICD-10-CM | POA: Diagnosis not present

## 2016-12-13 DIAGNOSIS — Z9013 Acquired absence of bilateral breasts and nipples: Secondary | ICD-10-CM | POA: Diagnosis not present

## 2016-12-13 DIAGNOSIS — Z90710 Acquired absence of both cervix and uterus: Secondary | ICD-10-CM | POA: Diagnosis not present

## 2016-12-13 DIAGNOSIS — I340 Nonrheumatic mitral (valve) insufficiency: Secondary | ICD-10-CM | POA: Diagnosis not present

## 2016-12-13 DIAGNOSIS — I350 Nonrheumatic aortic (valve) stenosis: Secondary | ICD-10-CM | POA: Insufficient documentation

## 2016-12-13 DIAGNOSIS — I440 Atrioventricular block, first degree: Secondary | ICD-10-CM | POA: Diagnosis not present

## 2016-12-13 DIAGNOSIS — J90 Pleural effusion, not elsewhere classified: Secondary | ICD-10-CM | POA: Diagnosis not present

## 2016-12-13 DIAGNOSIS — J329 Chronic sinusitis, unspecified: Secondary | ICD-10-CM | POA: Diagnosis not present

## 2016-12-13 DIAGNOSIS — J45909 Unspecified asthma, uncomplicated: Secondary | ICD-10-CM | POA: Diagnosis present

## 2016-12-13 DIAGNOSIS — I160 Hypertensive urgency: Secondary | ICD-10-CM | POA: Diagnosis not present

## 2016-12-13 DIAGNOSIS — R531 Weakness: Secondary | ICD-10-CM | POA: Diagnosis not present

## 2016-12-13 DIAGNOSIS — I358 Other nonrheumatic aortic valve disorders: Secondary | ICD-10-CM | POA: Diagnosis not present

## 2016-12-13 DIAGNOSIS — R4020 Unspecified coma: Secondary | ICD-10-CM | POA: Diagnosis not present

## 2016-12-13 DIAGNOSIS — I6523 Occlusion and stenosis of bilateral carotid arteries: Secondary | ICD-10-CM | POA: Diagnosis not present

## 2016-12-13 DIAGNOSIS — M549 Dorsalgia, unspecified: Secondary | ICD-10-CM | POA: Diagnosis present

## 2016-12-13 DIAGNOSIS — G8929 Other chronic pain: Secondary | ICD-10-CM | POA: Diagnosis present

## 2016-12-13 DIAGNOSIS — M810 Age-related osteoporosis without current pathological fracture: Secondary | ICD-10-CM | POA: Diagnosis present

## 2016-12-13 DIAGNOSIS — J449 Chronic obstructive pulmonary disease, unspecified: Secondary | ICD-10-CM | POA: Diagnosis present

## 2016-12-13 DIAGNOSIS — I361 Nonrheumatic tricuspid (valve) insufficiency: Secondary | ICD-10-CM | POA: Diagnosis not present

## 2016-12-13 DIAGNOSIS — E860 Dehydration: Secondary | ICD-10-CM | POA: Diagnosis not present

## 2016-12-13 DIAGNOSIS — R404 Transient alteration of awareness: Secondary | ICD-10-CM | POA: Diagnosis not present

## 2016-12-18 DIAGNOSIS — E559 Vitamin D deficiency, unspecified: Secondary | ICD-10-CM | POA: Insufficient documentation

## 2016-12-18 DIAGNOSIS — Z85038 Personal history of other malignant neoplasm of large intestine: Secondary | ICD-10-CM | POA: Insufficient documentation

## 2016-12-18 DIAGNOSIS — Z853 Personal history of malignant neoplasm of breast: Secondary | ICD-10-CM | POA: Insufficient documentation

## 2016-12-24 DIAGNOSIS — Z8781 Personal history of (healed) traumatic fracture: Secondary | ICD-10-CM | POA: Diagnosis not present

## 2016-12-24 DIAGNOSIS — M419 Scoliosis, unspecified: Secondary | ICD-10-CM | POA: Diagnosis not present

## 2016-12-25 DIAGNOSIS — R55 Syncope and collapse: Secondary | ICD-10-CM | POA: Diagnosis not present

## 2016-12-27 DIAGNOSIS — I6523 Occlusion and stenosis of bilateral carotid arteries: Secondary | ICD-10-CM | POA: Insufficient documentation

## 2016-12-27 DIAGNOSIS — I10 Essential (primary) hypertension: Secondary | ICD-10-CM | POA: Diagnosis not present

## 2016-12-27 DIAGNOSIS — K148 Other diseases of tongue: Secondary | ICD-10-CM | POA: Insufficient documentation

## 2016-12-27 DIAGNOSIS — I517 Cardiomegaly: Secondary | ICD-10-CM | POA: Diagnosis not present

## 2016-12-27 DIAGNOSIS — R55 Syncope and collapse: Secondary | ICD-10-CM | POA: Diagnosis not present

## 2016-12-27 DIAGNOSIS — R011 Cardiac murmur, unspecified: Secondary | ICD-10-CM | POA: Insufficient documentation

## 2016-12-27 DIAGNOSIS — I951 Orthostatic hypotension: Secondary | ICD-10-CM | POA: Diagnosis not present

## 2017-01-17 DIAGNOSIS — I350 Nonrheumatic aortic (valve) stenosis: Secondary | ICD-10-CM | POA: Diagnosis not present

## 2017-01-17 DIAGNOSIS — R55 Syncope and collapse: Secondary | ICD-10-CM | POA: Insufficient documentation

## 2017-01-17 DIAGNOSIS — I10 Essential (primary) hypertension: Secondary | ICD-10-CM | POA: Diagnosis not present

## 2017-03-04 DIAGNOSIS — Z9181 History of falling: Secondary | ICD-10-CM | POA: Diagnosis not present

## 2017-03-04 DIAGNOSIS — E785 Hyperlipidemia, unspecified: Secondary | ICD-10-CM | POA: Diagnosis not present

## 2017-03-04 DIAGNOSIS — J449 Chronic obstructive pulmonary disease, unspecified: Secondary | ICD-10-CM | POA: Diagnosis not present

## 2017-03-04 DIAGNOSIS — I10 Essential (primary) hypertension: Secondary | ICD-10-CM | POA: Diagnosis not present

## 2017-03-06 DIAGNOSIS — R293 Abnormal posture: Secondary | ICD-10-CM | POA: Diagnosis not present

## 2017-03-06 DIAGNOSIS — R262 Difficulty in walking, not elsewhere classified: Secondary | ICD-10-CM | POA: Diagnosis not present

## 2017-03-06 DIAGNOSIS — M6281 Muscle weakness (generalized): Secondary | ICD-10-CM | POA: Diagnosis not present

## 2017-03-06 DIAGNOSIS — R2681 Unsteadiness on feet: Secondary | ICD-10-CM | POA: Diagnosis not present

## 2017-03-07 DIAGNOSIS — R2681 Unsteadiness on feet: Secondary | ICD-10-CM | POA: Diagnosis not present

## 2017-03-07 DIAGNOSIS — R293 Abnormal posture: Secondary | ICD-10-CM | POA: Diagnosis not present

## 2017-03-07 DIAGNOSIS — R262 Difficulty in walking, not elsewhere classified: Secondary | ICD-10-CM | POA: Diagnosis not present

## 2017-03-07 DIAGNOSIS — M6281 Muscle weakness (generalized): Secondary | ICD-10-CM | POA: Diagnosis not present

## 2017-03-11 DIAGNOSIS — R293 Abnormal posture: Secondary | ICD-10-CM | POA: Diagnosis not present

## 2017-03-11 DIAGNOSIS — M545 Low back pain: Secondary | ICD-10-CM | POA: Diagnosis not present

## 2017-03-11 DIAGNOSIS — M6281 Muscle weakness (generalized): Secondary | ICD-10-CM | POA: Diagnosis not present

## 2017-03-11 DIAGNOSIS — G8929 Other chronic pain: Secondary | ICD-10-CM | POA: Diagnosis not present

## 2017-03-11 DIAGNOSIS — R2681 Unsteadiness on feet: Secondary | ICD-10-CM | POA: Diagnosis not present

## 2017-03-11 DIAGNOSIS — I10 Essential (primary) hypertension: Secondary | ICD-10-CM | POA: Diagnosis not present

## 2017-03-11 DIAGNOSIS — R262 Difficulty in walking, not elsewhere classified: Secondary | ICD-10-CM | POA: Diagnosis not present

## 2017-03-13 DIAGNOSIS — R2681 Unsteadiness on feet: Secondary | ICD-10-CM | POA: Diagnosis not present

## 2017-03-13 DIAGNOSIS — M6281 Muscle weakness (generalized): Secondary | ICD-10-CM | POA: Diagnosis not present

## 2017-03-13 DIAGNOSIS — R293 Abnormal posture: Secondary | ICD-10-CM | POA: Diagnosis not present

## 2017-03-13 DIAGNOSIS — R262 Difficulty in walking, not elsewhere classified: Secondary | ICD-10-CM | POA: Diagnosis not present

## 2017-03-15 DIAGNOSIS — J449 Chronic obstructive pulmonary disease, unspecified: Secondary | ICD-10-CM | POA: Diagnosis not present

## 2017-03-15 DIAGNOSIS — R2681 Unsteadiness on feet: Secondary | ICD-10-CM | POA: Diagnosis not present

## 2017-03-15 DIAGNOSIS — R0602 Shortness of breath: Secondary | ICD-10-CM | POA: Diagnosis not present

## 2017-03-15 DIAGNOSIS — J9801 Acute bronchospasm: Secondary | ICD-10-CM | POA: Diagnosis not present

## 2017-03-15 DIAGNOSIS — M6281 Muscle weakness (generalized): Secondary | ICD-10-CM | POA: Diagnosis not present

## 2017-03-15 DIAGNOSIS — R262 Difficulty in walking, not elsewhere classified: Secondary | ICD-10-CM | POA: Diagnosis not present

## 2017-03-15 DIAGNOSIS — R293 Abnormal posture: Secondary | ICD-10-CM | POA: Diagnosis not present

## 2017-03-18 DIAGNOSIS — R262 Difficulty in walking, not elsewhere classified: Secondary | ICD-10-CM | POA: Diagnosis not present

## 2017-03-18 DIAGNOSIS — R2681 Unsteadiness on feet: Secondary | ICD-10-CM | POA: Diagnosis not present

## 2017-03-18 DIAGNOSIS — R293 Abnormal posture: Secondary | ICD-10-CM | POA: Diagnosis not present

## 2017-03-18 DIAGNOSIS — M6281 Muscle weakness (generalized): Secondary | ICD-10-CM | POA: Diagnosis not present

## 2017-03-18 DIAGNOSIS — I10 Essential (primary) hypertension: Secondary | ICD-10-CM | POA: Diagnosis not present

## 2017-03-18 NOTE — Progress Notes (Signed)
[facility] Note  03/19/2017     CHIEF COMPLAINT Patient presents for Retina Evaluation   HISTORY OF PRESENT ILLNESS: Donna Greene is a 84 y.o. female who presents to the clinic today for:   HPI    Retina Evaluation    In both eyes.  This started 10 years ago.  Associated Symptoms Negative for Blind Spot, Glare, Shoulder/Hip pain, Fatigue, Jaw Claudication, Photophobia, Distortion, Floaters, Redness, Scalp Tenderness, Weight Loss, Fever, Trauma, Pain and Flashes.  Context:  distance vision, mid-range vision and near vision.  Treatments tried include no treatments, laser and surgery.  Response to treatment was mild improvement.  I, the attending physician,  performed the HPI with the patient and updated documentation appropriately.          Comments    Referral of Dr. Earna Coder for retinal eval.Patient states "she has had blurred vision for sometime now,she has had laser tx  for the last few years, cataract sx Ou appx 10 years ago. Denies wavy vision, flashes and ocular pain. Denies eye gtt's, takes multivitamins qd, pt states she saw a dr for glasses, but he told her she needed to see a medical dr, pt has seen other drs, but no one will tell her why she is unable to see, pts current gl rx is 3-4 yrs old, pt states she is unable to see the TV at home,        Last edited by Bernarda Caffey, MD on 03/19/2017  1:49 PM. (History)    Pt states she was referred over by PCP; Pt states she does not have a "regular eye doctor"; Pt states she went to [facility] and was told she was unable to get glasses due to "having a problem" with OU; Pt denies dx of DM, endorses dx of HTN; Pt states she had cataract sx done "in [place]"; Pt denies any other surgeries;   Referring physician: Wynn Banker, [address] [place], [place] [address]  HISTORICAL INFORMATION:   Selected notes from the MEDICAL RECORD NUMBER Referred by [facility] and Renaldo Reel, PA-C for HTN;  LEE-  Ocular Hx-  PMH- atherosclerosis of both carotid arteries, COPD, HTN, hx breast ca, hx colon ca, hyperlipidemia, osteoporosis     CURRENT MEDICATIONS: No current outpatient medications on file. (Ophthalmic Drugs)   No current facility-administered medications for this visit.  (Ophthalmic Drugs)   Current Outpatient Medications (Other)  Medication Sig  . lisinopril (PRINIVIL,ZESTRIL) 20 MG tablet Take 20 mg by mouth daily.  Marland Kitchen loratadine (CLARITIN) 10 MG tablet Take 10 mg by mouth daily.  . meloxicam (MOBIC) 7.5 MG tablet Take 7.5 mg by mouth daily.  . Multiple Vitamins-Minerals (CENTRUM SILVER PO) Take 1 capsule by mouth daily.    . zafirlukast (ACCOLATE) 10 MG tablet Take 10 mg by mouth 2 (two) times daily.  Marland Kitchen albuterol (PROAIR HFA) 108 (90 BASE) MCG/ACT inhaler Inhale 2 puffs into the lungs 4 (four) times daily as needed for wheezing or shortness of breath.  . B-D ULTRAFINE III SHORT PEN 31G X 8 MM MISC USE WITH INJECTION AS DIRECTED (Patient not taking: Reported on 03/19/2017)  . Calcium Carbonate-Vitamin D (CALTRATE 600+D) 600-400 MG-UNIT per tablet Take 1 tablet by mouth 2 (two) times daily.    Marland Kitchen lidocaine (LIDODERM) 5 % Place 1 patch onto the skin daily. Remove & Discard patch within 12 hours or as directed by MD (Patient not taking: Reported  on 03/19/2017)  . mometasone (NASONEX) 50 MCG/ACT nasal spray Place 2 sprays into the nose daily as needed. (Patient not taking: Reported on 03/19/2017)  . mupirocin ointment (BACTROBAN) 2 % Apply topically 3 (three) times daily. (Patient not taking: Reported on 03/19/2017)  . predniSONE (DELTASONE) 20 MG tablet 2 daily with food for 5 days. (Patient not taking: Reported on 03/19/2017)  . zafirlukast (ACCOLATE) 10 MG tablet Take 10 mg by mouth as needed.     No current facility-administered medications for this visit.  (Other)      REVIEW OF SYSTEMS: ROS    Positive for: Cardiovascular, Eyes   Negative for:  Constitutional, Gastrointestinal, Neurological, Skin, Genitourinary, Musculoskeletal, HENT, Endocrine, Respiratory, Psychiatric, Allergic/Imm, Heme/Lymph   Last edited by Zenovia Jordan, LPN on 1/91/4782  9:56 PM. (History)       ALLERGIES Allergies  Allergen Reactions  . Tape Rash    PAST MEDICAL HISTORY Past Medical History:  Diagnosis Date  . Abnormal heart rhythm   . Asthma   . Chronic headaches   . Colon cancer ([place])   . Emphysema   . Hypertension   . Type II or unspecified type diabetes mellitus without mention of complication, not stated as uncontrolled    Past Surgical History:  Procedure Laterality Date  . APPENDECTOMY    . BACK SURGERY    . BREAST SURGERY    . CARDIAC VALVE SURGERY    . CATARACT EXTRACTION    . EYE SURGERY    . NECK SURGERY    . VESICOVAGINAL FISTULA CLOSURE W/ TAH      FAMILY HISTORY Family History  Problem Relation Age of Onset  . Emphysema Mother        smoker  . Lung cancer Mother   . Emphysema Father        smoker  . Colon cancer Father   . Emphysema Sister        smoker  . Emphysema Brother   . Allergies Other        grandkids  . Asthma Daughter   . Lung cancer Sister   . Prostate cancer Brother     SOCIAL HISTORY Social History   Tobacco Use  . Smoking status: Never Smoker  . Smokeless tobacco: Never Used  Substance Use Topics  . Alcohol use: No  . Drug use: No         OPHTHALMIC EXAM:  Base Eye Exam    Visual Acuity (Snellen - Linear)      Right Left   Dist cc 20/50 20/40   Dist ph cc 20/30 20/40 +2   Correction:  Glasses       Tonometry (Tonopen, 1:32 PM)      Right Left   Pressure 14 18       Pupils      Dark Light Shape React APD   Right 3 2 Round Brisk None   Left 3 2 Round Brisk None       Visual Fields (Counting fingers)      Left Right    Full Full       Extraocular Movement      Right Left    Full, Ortho Full, Ortho       Neuro/Psych    Oriented x3:  Yes   Mood/Affect:   Normal       Dilation    Both eyes:  1.0% Mydriacyl, 2.5% Phenylephrine @ 1:32 PM        Slit Lamp  and Fundus Exam    External Exam      Right Left   External Brow ptosis        Slit Lamp Exam      Right Left   Lids/Lashes Dermatochalasis - upper lid with hooding of lashes Dermatochalasis - upper lid with hooding of lashes   Conjunctiva/Sclera White and quiet White and quiet   Cornea Arcus Arcus   Anterior Chamber Deep and quiet Deep and quiet   Iris Round and dilated Round and dilated   Lens PC IOL in good position with open PC PC IOL in good position with open PC   Vitreous Vitreous syneresis, Posterior vitreous detachment Vitreous syneresis, Posterior vitreous detachment       Fundus Exam      Right Left   Disc Normal Normal   C/D Ratio 0.2 0.2   Macula Blunted foveal reflex, mild Retinal pigment epithelial mottling, mild inferior fibrosis Blunted foveal reflex, mild Retinal pigment epithelial mottling   Vessels Mild Vascular attenuation, otherwise normal Mild Vascular attenuation, otherwise normal   Periphery Attached, 360 Reticular degeneration Attached, mild Reticular degeneration        Refraction    Wearing Rx      Sphere Cylinder Axis   Right -1.50 +0.50 143   Left -2.00 +1.25 166   Age:  3-4 yr   Type:  Bifocal       Manifest Refraction      Sphere Cylinder Axis Dist VA   Right -3.25 +0.50 143 20/30   Left -2.50 +1.75 166 20/30          IMAGING AND PROCEDURES  Imaging and Procedures for 03/19/17  OCT, Retina - OU - Both Eyes     Right Eye Quality was good. Central Foveal Thickness: 284. Progression has no prior data. Findings include normal foveal contour, no IRF, no SRF (Trace ERM, thin choroid).   Left Eye Quality was good. Central Foveal Thickness: 289. Progression has no prior data. Findings include abnormal foveal contour, intraretinal fluid, no SRF (Single cyst temporal fovea, Trace ERM, thin choroid).   Notes *Images captured and  stored on drive  Diagnosis / Impression:  OD: NFP, No IRF/SRF OS: cystic change without SRF -- single cyst temporal macula  Clinical management:  See below  Abbreviations: NFP - Normal foveal profile. CME - cystoid macular edema. PED - pigment epithelial detachment. IRF - intraretinal fluid. SRF - subretinal fluid. EZ - ellipsoid zone. ERM - epiretinal membrane. ORA - outer retinal atrophy. ORT - outer retinal tubulation. SRHM - subretinal hyper-reflective material                  ASSESSMENT/PLAN:    ICD-10-CM   1. Retinal edema H35.81 OCT, Retina - OU - Both Eyes  2. Posterior vitreous detachment of both eyes H43.813   3. Pseudophakia of both eyes Z96.1     1. Retinal edema, retinal cyst OS - single small cyst, temporal fovea - asymptomatic, likely minimal visual significance - BCVA 20/30 - pt wishes to monitor - recommend new glasses -- pt to go to Optometrist for MRx  2. PVD / vitreous syneresis OU  Discussed findings and prognosis  No RT or RD on 360 scleral depressed exam  Reviewed s/s of RT/RD  Strict return precautions for any such RT/RD signs/symptoms  F/U 1 year  3,4. Hypertensive retinopathy OU - discussed importance of tight BP control - monitor  5. Pseudophakia OU  - s/p CE/IOL  -  doing well  - monitor   Ophthalmic Meds Ordered this visit:  No orders of the defined types were placed in this encounter.      Return in about 1 year (around 03/20/2018) for F/U PVD OU.  There are no Patient Instructions on file for this visit.   Explained the diagnoses, plan, and follow up with the patient and they expressed understanding.  Patient expressed understanding of the importance of proper follow up care.   This document serves as a record of services personally performed by Gardiner Sleeper, MD, PhD. It was created on their behalf by Catha Brow, [place], a certified ophthalmic assistant. The creation of this record is the provider's dictation and/or  activities during the visit.  Electronically signed by: Catha Brow, [place]  03/19/17 9:53 PM   Gardiner Sleeper, M.D., Ph.D. Diseases & Surgery of the Retina and [place] 03/19/17   I have reviewed the above documentation for accuracy and completeness, and I agree with the above. Gardiner Sleeper, M.D., Ph.D. 03/19/17 10:05 PM     Abbreviations: M myopia (nearsighted); A astigmatism; H hyperopia (farsighted); P presbyopia; Mrx spectacle prescription;  CTL contact lenses; OD right eye; OS left eye; OU both eyes  XT exotropia; ET esotropia; PEK punctate epithelial keratitis; PEE punctate epithelial erosions; DES dry eye syndrome; MGD meibomian gland dysfunction; ATs artificial tears; PFAT's preservative free artificial tears; [place] nuclear sclerotic cataract; PSC posterior subcapsular cataract; ERM epi-retinal membrane; PVD posterior vitreous detachment; RD retinal detachment; DM diabetes mellitus; DR diabetic retinopathy; NPDR non-proliferative diabetic retinopathy; PDR proliferative diabetic retinopathy; CSME clinically significant macular edema; DME diabetic macular edema; dbh dot blot hemorrhages; CWS cotton wool spot; POAG primary open angle glaucoma; C/D cup-to-disc ratio; HVF humphrey visual field; GVF goldmann visual field; OCT optical coherence tomography; IOP intraocular pressure; BRVO Branch retinal vein occlusion; CRVO central retinal vein occlusion; CRAO central retinal artery occlusion; BRAO branch retinal artery occlusion; RT retinal tear; SB scleral buckle; PPV pars plana vitrectomy; VH Vitreous hemorrhage; PRP panretinal laser photocoagulation; IVK intravitreal kenalog; VMT vitreomacular traction; MH Macular hole;  NVD neovascularization of the disc; NVE neovascularization elsewhere; AREDS age related eye disease study; ARMD age related macular degeneration; POAG primary open angle glaucoma; EBMD epithelial/anterior basement membrane dystrophy; ACIOL  anterior chamber intraocular lens; IOL intraocular lens; PCIOL posterior chamber intraocular lens; Phaco/IOL phacoemulsification with intraocular lens placement; [place] photorefractive keratectomy; LASIK laser assisted in situ keratomileusis; HTN hypertension; DM diabetes mellitus; COPD chronic obstructive pulmonary disease

## 2017-03-19 ENCOUNTER — Encounter (INDEPENDENT_AMBULATORY_CARE_PROVIDER_SITE_OTHER): Payer: Self-pay | Admitting: Ophthalmology

## 2017-03-19 ENCOUNTER — Ambulatory Visit (INDEPENDENT_AMBULATORY_CARE_PROVIDER_SITE_OTHER): Payer: Medicare Other | Admitting: Ophthalmology

## 2017-03-19 DIAGNOSIS — H35033 Hypertensive retinopathy, bilateral: Secondary | ICD-10-CM | POA: Diagnosis not present

## 2017-03-19 DIAGNOSIS — Z961 Presence of intraocular lens: Secondary | ICD-10-CM | POA: Diagnosis not present

## 2017-03-19 DIAGNOSIS — H43813 Vitreous degeneration, bilateral: Secondary | ICD-10-CM

## 2017-03-19 DIAGNOSIS — H3581 Retinal edema: Secondary | ICD-10-CM

## 2017-03-19 DIAGNOSIS — I10 Essential (primary) hypertension: Secondary | ICD-10-CM

## 2017-03-20 DIAGNOSIS — M6281 Muscle weakness (generalized): Secondary | ICD-10-CM | POA: Diagnosis not present

## 2017-03-20 DIAGNOSIS — R2681 Unsteadiness on feet: Secondary | ICD-10-CM | POA: Diagnosis not present

## 2017-03-20 DIAGNOSIS — R293 Abnormal posture: Secondary | ICD-10-CM | POA: Diagnosis not present

## 2017-03-20 DIAGNOSIS — R262 Difficulty in walking, not elsewhere classified: Secondary | ICD-10-CM | POA: Diagnosis not present

## 2017-03-22 DIAGNOSIS — M6281 Muscle weakness (generalized): Secondary | ICD-10-CM | POA: Diagnosis not present

## 2017-03-22 DIAGNOSIS — R2681 Unsteadiness on feet: Secondary | ICD-10-CM | POA: Diagnosis not present

## 2017-03-22 DIAGNOSIS — R293 Abnormal posture: Secondary | ICD-10-CM | POA: Diagnosis not present

## 2017-03-22 DIAGNOSIS — R262 Difficulty in walking, not elsewhere classified: Secondary | ICD-10-CM | POA: Diagnosis not present

## 2017-03-25 DIAGNOSIS — R2681 Unsteadiness on feet: Secondary | ICD-10-CM | POA: Diagnosis not present

## 2017-03-25 DIAGNOSIS — R262 Difficulty in walking, not elsewhere classified: Secondary | ICD-10-CM | POA: Diagnosis not present

## 2017-03-25 DIAGNOSIS — M6281 Muscle weakness (generalized): Secondary | ICD-10-CM | POA: Diagnosis not present

## 2017-03-25 DIAGNOSIS — R293 Abnormal posture: Secondary | ICD-10-CM | POA: Diagnosis not present

## 2017-03-27 DIAGNOSIS — M6281 Muscle weakness (generalized): Secondary | ICD-10-CM | POA: Diagnosis not present

## 2017-03-27 DIAGNOSIS — R293 Abnormal posture: Secondary | ICD-10-CM | POA: Diagnosis not present

## 2017-03-27 DIAGNOSIS — R262 Difficulty in walking, not elsewhere classified: Secondary | ICD-10-CM | POA: Diagnosis not present

## 2017-03-27 DIAGNOSIS — R2681 Unsteadiness on feet: Secondary | ICD-10-CM | POA: Diagnosis not present

## 2017-03-29 DIAGNOSIS — R0602 Shortness of breath: Secondary | ICD-10-CM | POA: Diagnosis not present

## 2017-03-29 DIAGNOSIS — R262 Difficulty in walking, not elsewhere classified: Secondary | ICD-10-CM | POA: Diagnosis not present

## 2017-03-29 DIAGNOSIS — R293 Abnormal posture: Secondary | ICD-10-CM | POA: Diagnosis not present

## 2017-03-29 DIAGNOSIS — I10 Essential (primary) hypertension: Secondary | ICD-10-CM | POA: Diagnosis not present

## 2017-03-29 DIAGNOSIS — M6281 Muscle weakness (generalized): Secondary | ICD-10-CM | POA: Diagnosis not present

## 2017-03-29 DIAGNOSIS — R2681 Unsteadiness on feet: Secondary | ICD-10-CM | POA: Diagnosis not present

## 2017-03-29 DIAGNOSIS — J9801 Acute bronchospasm: Secondary | ICD-10-CM | POA: Diagnosis not present

## 2017-04-02 DIAGNOSIS — G8929 Other chronic pain: Secondary | ICD-10-CM | POA: Diagnosis not present

## 2017-04-02 DIAGNOSIS — Z8781 Personal history of (healed) traumatic fracture: Secondary | ICD-10-CM | POA: Diagnosis not present

## 2017-04-02 DIAGNOSIS — M5441 Lumbago with sciatica, right side: Secondary | ICD-10-CM | POA: Diagnosis not present

## 2017-04-03 DIAGNOSIS — M545 Low back pain: Secondary | ICD-10-CM | POA: Diagnosis not present

## 2017-04-03 DIAGNOSIS — G894 Chronic pain syndrome: Secondary | ICD-10-CM | POA: Insufficient documentation

## 2017-04-03 DIAGNOSIS — Z8781 Personal history of (healed) traumatic fracture: Secondary | ICD-10-CM | POA: Diagnosis not present

## 2017-04-03 DIAGNOSIS — M47816 Spondylosis without myelopathy or radiculopathy, lumbar region: Secondary | ICD-10-CM | POA: Diagnosis not present

## 2017-04-03 DIAGNOSIS — M5136 Other intervertebral disc degeneration, lumbar region: Secondary | ICD-10-CM | POA: Insufficient documentation

## 2017-04-03 DIAGNOSIS — M5459 Other low back pain: Secondary | ICD-10-CM | POA: Insufficient documentation

## 2017-04-08 DIAGNOSIS — R2681 Unsteadiness on feet: Secondary | ICD-10-CM | POA: Diagnosis not present

## 2017-04-08 DIAGNOSIS — R262 Difficulty in walking, not elsewhere classified: Secondary | ICD-10-CM | POA: Diagnosis not present

## 2017-04-08 DIAGNOSIS — R293 Abnormal posture: Secondary | ICD-10-CM | POA: Diagnosis not present

## 2017-04-08 DIAGNOSIS — M6281 Muscle weakness (generalized): Secondary | ICD-10-CM | POA: Diagnosis not present

## 2017-04-10 DIAGNOSIS — R262 Difficulty in walking, not elsewhere classified: Secondary | ICD-10-CM | POA: Diagnosis not present

## 2017-04-10 DIAGNOSIS — R2681 Unsteadiness on feet: Secondary | ICD-10-CM | POA: Diagnosis not present

## 2017-04-10 DIAGNOSIS — R293 Abnormal posture: Secondary | ICD-10-CM | POA: Diagnosis not present

## 2017-04-10 DIAGNOSIS — M6281 Muscle weakness (generalized): Secondary | ICD-10-CM | POA: Diagnosis not present

## 2017-04-12 DIAGNOSIS — R2681 Unsteadiness on feet: Secondary | ICD-10-CM | POA: Diagnosis not present

## 2017-04-12 DIAGNOSIS — R293 Abnormal posture: Secondary | ICD-10-CM | POA: Diagnosis not present

## 2017-04-12 DIAGNOSIS — R262 Difficulty in walking, not elsewhere classified: Secondary | ICD-10-CM | POA: Diagnosis not present

## 2017-04-12 DIAGNOSIS — M6281 Muscle weakness (generalized): Secondary | ICD-10-CM | POA: Diagnosis not present

## 2017-04-15 DIAGNOSIS — R262 Difficulty in walking, not elsewhere classified: Secondary | ICD-10-CM | POA: Diagnosis not present

## 2017-04-15 DIAGNOSIS — M6281 Muscle weakness (generalized): Secondary | ICD-10-CM | POA: Diagnosis not present

## 2017-04-15 DIAGNOSIS — R293 Abnormal posture: Secondary | ICD-10-CM | POA: Diagnosis not present

## 2017-04-15 DIAGNOSIS — R2681 Unsteadiness on feet: Secondary | ICD-10-CM | POA: Diagnosis not present

## 2017-04-18 DIAGNOSIS — M6281 Muscle weakness (generalized): Secondary | ICD-10-CM | POA: Diagnosis not present

## 2017-04-18 DIAGNOSIS — R293 Abnormal posture: Secondary | ICD-10-CM | POA: Diagnosis not present

## 2017-04-18 DIAGNOSIS — R2681 Unsteadiness on feet: Secondary | ICD-10-CM | POA: Diagnosis not present

## 2017-04-18 DIAGNOSIS — R262 Difficulty in walking, not elsewhere classified: Secondary | ICD-10-CM | POA: Diagnosis not present

## 2017-04-19 DIAGNOSIS — R293 Abnormal posture: Secondary | ICD-10-CM | POA: Diagnosis not present

## 2017-04-19 DIAGNOSIS — R2681 Unsteadiness on feet: Secondary | ICD-10-CM | POA: Diagnosis not present

## 2017-04-19 DIAGNOSIS — R262 Difficulty in walking, not elsewhere classified: Secondary | ICD-10-CM | POA: Diagnosis not present

## 2017-04-19 DIAGNOSIS — M6281 Muscle weakness (generalized): Secondary | ICD-10-CM | POA: Diagnosis not present

## 2017-05-02 DIAGNOSIS — M7918 Myalgia, other site: Secondary | ICD-10-CM | POA: Diagnosis not present

## 2017-05-28 DIAGNOSIS — M818 Other osteoporosis without current pathological fracture: Secondary | ICD-10-CM | POA: Diagnosis not present

## 2017-05-28 DIAGNOSIS — Z79899 Other long term (current) drug therapy: Secondary | ICD-10-CM | POA: Diagnosis not present

## 2017-05-28 DIAGNOSIS — Z8781 Personal history of (healed) traumatic fracture: Secondary | ICD-10-CM | POA: Diagnosis not present

## 2017-05-28 DIAGNOSIS — E559 Vitamin D deficiency, unspecified: Secondary | ICD-10-CM | POA: Diagnosis not present

## 2017-05-28 DIAGNOSIS — Z1321 Encounter for screening for nutritional disorder: Secondary | ICD-10-CM | POA: Diagnosis not present

## 2017-06-06 DIAGNOSIS — M5136 Other intervertebral disc degeneration, lumbar region: Secondary | ICD-10-CM | POA: Diagnosis not present

## 2017-06-06 DIAGNOSIS — M7918 Myalgia, other site: Secondary | ICD-10-CM | POA: Diagnosis not present

## 2017-06-06 DIAGNOSIS — M545 Low back pain: Secondary | ICD-10-CM | POA: Diagnosis not present

## 2017-06-06 DIAGNOSIS — G894 Chronic pain syndrome: Secondary | ICD-10-CM | POA: Diagnosis not present

## 2017-06-06 DIAGNOSIS — M818 Other osteoporosis without current pathological fracture: Secondary | ICD-10-CM | POA: Diagnosis not present

## 2017-06-06 DIAGNOSIS — M47816 Spondylosis without myelopathy or radiculopathy, lumbar region: Secondary | ICD-10-CM | POA: Diagnosis not present

## 2017-08-06 DIAGNOSIS — G894 Chronic pain syndrome: Secondary | ICD-10-CM | POA: Diagnosis not present

## 2017-08-06 DIAGNOSIS — M7918 Myalgia, other site: Secondary | ICD-10-CM | POA: Diagnosis not present

## 2017-08-06 DIAGNOSIS — M47816 Spondylosis without myelopathy or radiculopathy, lumbar region: Secondary | ICD-10-CM | POA: Diagnosis not present

## 2017-08-06 DIAGNOSIS — M545 Low back pain: Secondary | ICD-10-CM | POA: Diagnosis not present

## 2017-08-10 DIAGNOSIS — H2513 Age-related nuclear cataract, bilateral: Secondary | ICD-10-CM | POA: Diagnosis not present

## 2017-08-29 DIAGNOSIS — M47816 Spondylosis without myelopathy or radiculopathy, lumbar region: Secondary | ICD-10-CM | POA: Diagnosis not present

## 2017-10-03 DIAGNOSIS — M47816 Spondylosis without myelopathy or radiculopathy, lumbar region: Secondary | ICD-10-CM | POA: Diagnosis not present

## 2017-10-03 DIAGNOSIS — M545 Low back pain: Secondary | ICD-10-CM | POA: Diagnosis not present

## 2017-10-14 DIAGNOSIS — K148 Other diseases of tongue: Secondary | ICD-10-CM | POA: Diagnosis not present

## 2017-10-14 DIAGNOSIS — I350 Nonrheumatic aortic (valve) stenosis: Secondary | ICD-10-CM | POA: Diagnosis not present

## 2017-10-14 DIAGNOSIS — I10 Essential (primary) hypertension: Secondary | ICD-10-CM | POA: Diagnosis not present

## 2017-10-14 DIAGNOSIS — J302 Other seasonal allergic rhinitis: Secondary | ICD-10-CM | POA: Diagnosis not present

## 2017-10-14 DIAGNOSIS — Z23 Encounter for immunization: Secondary | ICD-10-CM | POA: Diagnosis not present

## 2017-10-25 DIAGNOSIS — M47816 Spondylosis without myelopathy or radiculopathy, lumbar region: Secondary | ICD-10-CM | POA: Diagnosis not present

## 2017-11-01 DIAGNOSIS — M5136 Other intervertebral disc degeneration, lumbar region: Secondary | ICD-10-CM | POA: Diagnosis not present

## 2017-11-01 DIAGNOSIS — M818 Other osteoporosis without current pathological fracture: Secondary | ICD-10-CM | POA: Diagnosis not present

## 2017-11-01 DIAGNOSIS — Z23 Encounter for immunization: Secondary | ICD-10-CM | POA: Diagnosis not present

## 2017-11-01 DIAGNOSIS — J449 Chronic obstructive pulmonary disease, unspecified: Secondary | ICD-10-CM | POA: Diagnosis not present

## 2017-11-01 DIAGNOSIS — J3089 Other allergic rhinitis: Secondary | ICD-10-CM | POA: Diagnosis not present

## 2017-11-01 DIAGNOSIS — Z9181 History of falling: Secondary | ICD-10-CM | POA: Diagnosis not present

## 2017-11-01 DIAGNOSIS — E559 Vitamin D deficiency, unspecified: Secondary | ICD-10-CM | POA: Diagnosis not present

## 2017-11-01 DIAGNOSIS — E785 Hyperlipidemia, unspecified: Secondary | ICD-10-CM | POA: Diagnosis not present

## 2017-11-01 DIAGNOSIS — I10 Essential (primary) hypertension: Secondary | ICD-10-CM | POA: Diagnosis not present

## 2017-11-01 DIAGNOSIS — M545 Low back pain: Secondary | ICD-10-CM | POA: Diagnosis not present

## 2017-11-14 ENCOUNTER — Encounter: Payer: Self-pay | Admitting: Allergy and Immunology

## 2017-11-14 ENCOUNTER — Ambulatory Visit (INDEPENDENT_AMBULATORY_CARE_PROVIDER_SITE_OTHER): Payer: Medicare Other | Admitting: Allergy and Immunology

## 2017-11-14 VITALS — BP 118/68 | HR 86 | Temp 98.4°F | Resp 18 | Ht 59.3 in | Wt 120.0 lb

## 2017-11-14 DIAGNOSIS — J4540 Moderate persistent asthma, uncomplicated: Secondary | ICD-10-CM

## 2017-11-14 DIAGNOSIS — R040 Epistaxis: Secondary | ICD-10-CM

## 2017-11-14 DIAGNOSIS — J3089 Other allergic rhinitis: Secondary | ICD-10-CM

## 2017-11-14 MED ORDER — MONTELUKAST SODIUM 10 MG PO TABS: 10.0000 mg | ORAL_TABLET | Freq: Every day | ORAL | 5 refills | Status: DC

## 2017-11-14 MED ORDER — FLUTICASONE PROPIONATE HFA 110 MCG/ACT IN AERO: INHALATION_SPRAY | RESPIRATORY_TRACT | 5 refills | Status: AC

## 2017-11-14 MED ORDER — ALBUTEROL SULFATE HFA 108 (90 BASE) MCG/ACT IN AERS: 2.0000 | INHALATION_SPRAY | RESPIRATORY_TRACT | 1 refills | Status: AC | PRN

## 2017-11-14 MED ORDER — AZELASTINE HCL 0.1 % NA SOLN: NASAL | 5 refills | Status: DC

## 2017-11-14 NOTE — Assessment & Plan Note (Signed)
Epicutaneous environmental tests were negative despite a positive histamine control.  Intradermal tests revealed borderline positive reactivity to major perennial mold mix 4.  A prescription has been provided for azelastine nasal spray, 1 spray per nostril 2 times daily as needed. Proper nasal spray technique has been discussed and demonstrated.   Nasal saline spray (i.e. Simply Saline) is recommended prior to medicated nasal sprays and as needed.  For thick post nasal drainage, add guaifenesin 600 mg (Mucinex)  twice daily as needed with adequate hydration as discussed.

## 2017-11-14 NOTE — Assessment & Plan Note (Addendum)
   Proper technique for stanching epistaxis has been discussed and demonstrated.  Nasal saline spray and/or nasal saline gel is recommended to moisturize nasal mucosa.  The use of a cool-mist humidifier during the night is recommended.  During epistaxis, if needed, oxymetazoline (Afrin) nasal spray may be applied to a cotton ball to help stanch the blood flow.  If this problem persists or progresses, otolaryngology evaluation may be warranted.  

## 2017-11-14 NOTE — Patient Instructions (Addendum)
Moderate persistent asthma  Discontinue Accolate.  A prescription has been provided for montelukast 10 mg daily at bedtime.  A prescription has been provided for Flovent (fluticasone) 110 g, 2 inhalations twice a day. To maximize pulmonary deposition, a spacer has been provided along with instructions for its proper administration with an HFA inhaler.  Albuterol HFA, 1-2 elations every 4-6 hours if needed.  Subjective and objective measures of pulmonary function will be followed and the treatment plan will be adjusted accordingly.  Epistaxis  Proper technique for stanching epistaxis has been discussed and demonstrated.  Nasal saline spray and/or nasal saline gel is recommended to moisturize nasal mucosa.  The use of a cool-mist humidifier during the night is recommended.  During epistaxis, if needed, oxymetazoline (Afrin) nasal spray may be applied to a cotton ball to help stanch the blood flow.  If this problem persists or progresses, otolaryngology evaluation may be warranted.  Allergic rhinitis with a nonallergic component Epicutaneous environmental tests were negative despite a positive histamine control.  Intradermal tests revealed borderline positive reactivity to major perennial mold mix 4.  A prescription has been provided for azelastine nasal spray, 1 spray per nostril 2 times daily as needed. Proper nasal spray technique has been discussed and demonstrated.   Nasal saline spray (i.e. Simply Saline) is recommended prior to medicated nasal sprays and as needed.  For thick post nasal drainage, add guaifenesin 600 mg (Mucinex)  twice daily as needed with adequate hydration as discussed.   Return in about 3 months (around 02/14/2018), or if symptoms worsen or fail to improve.  Control of Mold Allergen  Mold and fungi can grow on a variety of surfaces provided certain temperature and moisture conditions exist.  Outdoor molds grow on plants, decaying vegetation and soil.  The  major outdoor mold, Alternaria and Cladosporium, are found in very high numbers during hot and dry conditions.  Generally, a late Summer - Fall peak is seen for common outdoor fungal spores.  Rain will temporarily lower outdoor mold spore count, but counts rise rapidly when the rainy period ends.  The most important indoor molds are Aspergillus and Penicillium.  Dark, humid and poorly ventilated basements are ideal sites for mold growth.  The next most common sites of mold growth are the bathroom and the kitchen.  Outdoor [employer] 1. Use air conditioning and keep windows closed 2. Avoid exposure to decaying vegetation. 3. Avoid leaf raking. 4. Avoid grain handling. 5. Consider wearing a face mask if working in moldy areas.  Indoor Mold Control 1. Maintain humidity below 50%. 2. Clean washable surfaces with 5% bleach solution. 3. Remove sources e.g. Contaminated carpets.

## 2017-11-14 NOTE — Assessment & Plan Note (Signed)
   Discontinue Accolate.  A prescription has been provided for montelukast 10 mg daily at bedtime.  A prescription has been provided for Flovent (fluticasone) 110 g, 2 inhalations twice a day. To maximize pulmonary deposition, a spacer has been provided along with instructions for its proper administration with an HFA inhaler.  Albuterol HFA, 1-2 elations every 4-6 hours if needed.  Subjective and objective measures of pulmonary function will be followed and the treatment plan will be adjusted accordingly.

## 2017-11-14 NOTE — Progress Notes (Signed)
New Patient Note  RE: Donna Greene MRN: [id number] DOB: Apr 19, 1925 Date of Office Visit: 11/14/2017  Referring provider: Wynn Banker* Primary care provider: Wynn Banker, PA  Chief Complaint: Asthma and Allergic Rhinitis    History of present illness: Donna Greene is a 84 y.o. female seen today in consultation requested by Renaldo Reel, PA-C.  She is accompanied today by her son who assists with the history.  She experiences frequent nasal congestion, rhinorrhea, postnasal drainage,, and sinus pressure over the forehead and cheekbones.  No significant seasonal symptom variation has been noted nor have specific environmental triggers been identified.  She had been on aeroallergen immunotherapy injections for 2 years in 2005 and 2006.  She states that taking loratadine causes nosebleeds.  On occasion, she has epistaxis even when she is not taking loratadine.  She reports that she was diagnosed with asthma approximately 12 or 13 years ago.  She has taken Accolate twice daily for years, however despite compliance with this medication she still experiences asthma symptoms at least once daily on average. Nose bleeds  claritin casues nose bleeds  Nose itch  Sneeze and runny nose  Nasal congestion and sinus over the forehead and cheekbones.   No triggers.   Had been on itx x 2 years in 2005-2006.   Has taken accolate for years  Dx with asthma 12-13 yrs ago.   Asthma sx daily despite accolate.          Assessment and plan: Moderate persistent asthma  Discontinue Accolate.  A prescription has been provided for montelukast 10 mg daily at bedtime.  A prescription has been provided for Flovent (fluticasone) 110 g, 2 inhalations twice a day. To maximize pulmonary deposition, a spacer has been provided along with instructions for its proper administration with an HFA inhaler.  Albuterol HFA, 1-2 elations every 4-6 hours if  needed.  Subjective and objective measures of pulmonary function will be followed and the treatment plan will be adjusted accordingly.  Epistaxis  Proper technique for stanching epistaxis has been discussed and demonstrated.  Nasal saline spray and/or nasal saline gel is recommended to moisturize nasal mucosa.  The use of a cool-mist humidifier during the night is recommended.  During epistaxis, if needed, oxymetazoline (Afrin) nasal spray may be applied to a cotton ball to help stanch the blood flow.  If this problem persists or progresses, otolaryngology evaluation may be warranted.  Allergic rhinitis with a nonallergic component Epicutaneous environmental tests were negative despite a positive histamine control.  Intradermal tests revealed borderline positive reactivity to major perennial mold mix 4.  A prescription has been provided for azelastine nasal spray, 1 spray per nostril 2 times daily as needed. Proper nasal spray technique has been discussed and demonstrated.   Nasal saline spray (i.e. Simply Saline) is recommended prior to medicated nasal sprays and as needed.  For thick post nasal drainage, add guaifenesin 600 mg (Mucinex)  twice daily as needed with adequate hydration as discussed.   Meds ordered this encounter  Medications  . montelukast (SINGULAIR) 10 MG tablet    Sig: Take 1 tablet (10 mg total) by mouth at bedtime.    Dispense:  30 tablet    Refill:  5  . fluticasone (FLOVENT HFA) 110 MCG/ACT inhaler    Sig: Two puffs with spacer twice a day to prevent cough or wheeze. Rinse, gargle and spit after use.    Dispense:  1 Inhaler    Refill:  5  . albuterol (  PROAIR HFA) 108 (90 Base) MCG/ACT inhaler    Sig: Inhale 2 puffs into the lungs every 4 (four) hours as needed for wheezing or shortness of breath.    Dispense:  1 Inhaler    Refill:  1  . azelastine (ASTELIN) 0.1 % nasal spray    Sig: One spray each nostril twice a day as needed.    Dispense:  30 mL     Refill:  5    Diagnostics: Spirometry: Spirometry reveals an FVC of 2.09 L and an FEV1 of 1.72 L with an FEV1 ratio of 114%.  This study was performed while the patient was asymptomatic.  Please see scanned spirometry results for details. Epicutaneous testing: Negative despite a positive histamine control. Intradermal testing: Borderline positive reactivity to perennial major mold mix #4.    Physical examination: Blood pressure 118/68, pulse 86, temperature 98.4 F (36.9 C), temperature source Oral, resp. rate 18, height 4' 11.3" (1.506 m), weight 120 lb (54.4 kg), SpO2 96 %.  General: Alert, interactive, in no acute distress. HEENT: TMs pearly gray, turbinates moderately edematous without discharge, post-pharynx moderately erythematous. Neck: Supple without lymphadenopathy. Lungs: Clear to auscultation without wheezing, rhonchi or rales. CV: Normal S1, S2 without murmurs. Abdomen: Nondistended, nontender. Skin: Warm and dry, without lesions or rashes. Extremities:  No clubbing, cyanosis or edema. Neuro:   Grossly intact.  Review of systems:  Review of systems negative except as noted in HPI / PMHx or noted below: Review of Systems  Constitutional: Negative.   HENT: Negative.   Eyes: Negative.   Respiratory: Negative.   Cardiovascular: Negative.   Gastrointestinal: Negative.   Genitourinary: Negative.   Musculoskeletal: Negative.   Skin: Negative.   Neurological: Negative.   Endo/Heme/Allergies: Negative.   Psychiatric/Behavioral: Negative.     Past medical history:  Past Medical History:  Diagnosis Date  . Abnormal heart rhythm   . Allergic rhinitis   . Asthma   . Chronic headaches   . Colon cancer ([place])   . Emphysema   . Hypertension   . Type II or unspecified type diabetes mellitus without mention of complication, not stated as uncontrolled     Past surgical history:  Past Surgical History:  Procedure Laterality Date  . APPENDECTOMY    . BACK SURGERY    .  BREAST SURGERY    . CARDIAC VALVE SURGERY    . CATARACT EXTRACTION    . EYE SURGERY    . NECK SURGERY    . VESICOVAGINAL FISTULA CLOSURE W/ TAH      Family history: Family History  Problem Relation Age of Onset  . Emphysema Mother        smoker  . Lung cancer Mother   . Emphysema Father        smoker  . Colon cancer Father   . Emphysema Sister        smoker  . Emphysema Brother   . Allergies Other        grandkids  . Asthma Daughter   . Lung cancer Sister   . Prostate cancer Brother   . Allergic rhinitis Neg Hx   . Angioedema Neg Hx   . Eczema Neg Hx   . Immunodeficiency Neg Hx   . Urticaria Neg Hx     Social history: Social History   Socioeconomic History  . Marital status: Widowed    Spouse name: Not on file  . Number of children: Not on file  . Years of education: Not on  file  . Highest education level: Not on file  Occupational History  . Not on file  Social Needs  . Financial resource strain: Not on file  . Food insecurity:    Worry: Not on file    Inability: Not on file  . Transportation needs:    Medical: Not on file    Non-medical: Not on file  Tobacco Use  . Smoking status: Never Smoker  . Smokeless tobacco: Never Used  Substance and Sexual Activity  . Alcohol use: No  . Drug use: No  . Sexual activity: Not on file  Lifestyle  . Physical activity:    Days per week: Not on file    Minutes per session: Not on file  . Stress: Not on file  Relationships  . Social connections:    Talks on phone: Not on file    Gets together: Not on file    Attends religious service: Not on file    Active member of club or organization: Not on file    Attends meetings of clubs or organizations: Not on file    Relationship status: Not on file  . Intimate partner violence:    Fear of current or ex partner: Not on file    Emotionally abused: Not on file    Physically abused: Not on file    Forced sexual activity: Not on file  Other Topics Concern  . Not on  file  Social History Narrative  . Not on file   Environmental History: Patient lives in a 82 year old house with carpeting the bedroom and central air/heat.  There is no known mold/water damage in the home.  She is a never smoker without pets.  Allergies as of 11/14/2017      Reactions   Other Rash   Claritin [loratadine] Other (See Comments)   Nose bleeds and very dry nose   Tape Rash      Medication List        Accurate as of 11/14/17  3:57 PM. Always use your most recent med list.          albuterol 108 (90 Base) MCG/ACT inhaler Commonly known as:  PROVENTIL HFA;VENTOLIN HFA Inhale 2 puffs into the lungs every 4 (four) hours as needed for wheezing or shortness of breath.   azelastine 0.1 % nasal spray Commonly known as:  ASTELIN One spray each nostril twice a day as needed.   CALTRATE 600+D 600-400 MG-UNIT tablet Generic drug:  Calcium Carbonate-Vitamin D Take 1 tablet by mouth 2 (two) times daily.   CENTRUM SILVER PO Take 1 capsule by mouth daily.   diclofenac sodium 1 % Gel Commonly known as:  VOLTAREN   fluticasone 110 MCG/ACT inhaler Commonly known as:  FLOVENT HFA Two puffs with spacer twice a day to prevent cough or wheeze. Rinse, gargle and spit after use.   lisinopril 20 MG tablet Commonly known as:  PRINIVIL,ZESTRIL Take 20 mg by mouth daily.   loratadine 10 MG tablet Commonly known as:  CLARITIN Take 10 mg by mouth daily.   meloxicam 7.5 MG tablet Commonly known as:  MOBIC Take 7.5 mg by mouth daily.   mometasone 50 MCG/ACT nasal spray Commonly known as:  NASONEX Place 2 sprays into the nose daily as needed.   montelukast 10 MG tablet Commonly known as:  SINGULAIR Take 1 tablet (10 mg total) by mouth at bedtime.   zafirlukast 10 MG tablet Commonly known as:  ACCOLATE Take 10 mg by mouth 2 (two)  times daily.       Known medication allergies: Allergies  Allergen Reactions  . Other Rash  . Claritin [Loratadine] Other (See Comments)     Nose bleeds and very dry nose  . Tape Rash    I appreciate the opportunity to take part in Donna Greene's care. Please do not hesitate to contact me with questions.  Sincerely,   R. Edgar Frisk, MD

## 2017-11-26 ENCOUNTER — Other Ambulatory Visit: Payer: Self-pay

## 2017-12-11 DIAGNOSIS — Z8781 Personal history of (healed) traumatic fracture: Secondary | ICD-10-CM | POA: Diagnosis not present

## 2017-12-11 DIAGNOSIS — M810 Age-related osteoporosis without current pathological fracture: Secondary | ICD-10-CM | POA: Diagnosis not present

## 2017-12-11 DIAGNOSIS — Z79899 Other long term (current) drug therapy: Secondary | ICD-10-CM | POA: Diagnosis not present

## 2017-12-11 DIAGNOSIS — Z1321 Encounter for screening for nutritional disorder: Secondary | ICD-10-CM | POA: Diagnosis not present

## 2017-12-11 DIAGNOSIS — E559 Vitamin D deficiency, unspecified: Secondary | ICD-10-CM | POA: Diagnosis not present

## 2017-12-26 DIAGNOSIS — M810 Age-related osteoporosis without current pathological fracture: Secondary | ICD-10-CM | POA: Diagnosis not present

## 2017-12-26 DIAGNOSIS — M818 Other osteoporosis without current pathological fracture: Secondary | ICD-10-CM | POA: Diagnosis not present

## 2018-01-06 DIAGNOSIS — M810 Age-related osteoporosis without current pathological fracture: Secondary | ICD-10-CM | POA: Diagnosis not present

## 2018-02-20 ENCOUNTER — Ambulatory Visit: Payer: Medicare Other | Admitting: Allergy and Immunology

## 2018-03-20 ENCOUNTER — Other Ambulatory Visit: Payer: Self-pay

## 2018-03-20 MED ORDER — MONTELUKAST SODIUM 10 MG PO TABS: 10.0000 mg | ORAL_TABLET | Freq: Every day | ORAL | 0 refills | Status: AC

## 2018-03-20 MED ORDER — AZELASTINE HCL 0.1 % NA SOLN: NASAL | 0 refills | Status: AC

## 2018-03-26 ENCOUNTER — Encounter (INDEPENDENT_AMBULATORY_CARE_PROVIDER_SITE_OTHER): Payer: Medicare Other | Admitting: Ophthalmology

## 2021-10-21 DIAGNOSIS — I440 Atrioventricular block, first degree: Secondary | ICD-10-CM | POA: Diagnosis not present

## 2021-12-08 DEATH — deceased
